# Patient Record
Sex: Female | Born: 1945 | Race: White | Hispanic: No | Smoking: Former smoker
Health system: Southern US, Community
[De-identification: ages and names within clinical notes are randomized; demographics above are authoritative.]

## PROBLEM LIST (undated history)

## (undated) DIAGNOSIS — M1990 Unspecified osteoarthritis, unspecified site: Secondary | ICD-10-CM

## (undated) DIAGNOSIS — F259 Schizoaffective disorder, unspecified: Secondary | ICD-10-CM

## (undated) DIAGNOSIS — F319 Bipolar disorder, unspecified: Secondary | ICD-10-CM

## (undated) DIAGNOSIS — I4891 Unspecified atrial fibrillation: Secondary | ICD-10-CM

## (undated) DIAGNOSIS — K219 Gastro-esophageal reflux disease without esophagitis: Secondary | ICD-10-CM

## (undated) DIAGNOSIS — I509 Heart failure, unspecified: Secondary | ICD-10-CM

## (undated) DIAGNOSIS — J449 Chronic obstructive pulmonary disease, unspecified: Secondary | ICD-10-CM

---

## 2016-07-30 ENCOUNTER — Encounter (HOSPITAL_COMMUNITY): Payer: Self-pay | Admitting: Emergency Medicine

## 2016-07-30 ENCOUNTER — Observation Stay (HOSPITAL_COMMUNITY): Payer: Medicaid Other

## 2016-07-30 ENCOUNTER — Inpatient Hospital Stay (HOSPITAL_COMMUNITY)
Admission: EM | Admit: 2016-07-30 | Discharge: 2016-08-02 | DRG: 641 | Disposition: A | Discharge status: Skilled Nursing Facility | Payer: Medicaid Other | Attending: Internal Medicine | Admitting: Internal Medicine

## 2016-07-30 ENCOUNTER — Emergency Department (HOSPITAL_COMMUNITY): Payer: Medicaid Other

## 2016-07-30 DIAGNOSIS — E890 Postprocedural hypothyroidism: Secondary | ICD-10-CM | POA: Diagnosis present

## 2016-07-30 DIAGNOSIS — R55 Syncope and collapse: Secondary | ICD-10-CM | POA: Diagnosis not present

## 2016-07-30 DIAGNOSIS — I480 Paroxysmal atrial fibrillation: Secondary | ICD-10-CM | POA: Diagnosis present

## 2016-07-30 DIAGNOSIS — G43909 Migraine, unspecified, not intractable, without status migrainosus: Secondary | ICD-10-CM | POA: Diagnosis present

## 2016-07-30 DIAGNOSIS — N390 Urinary tract infection, site not specified: Secondary | ICD-10-CM | POA: Diagnosis present

## 2016-07-30 DIAGNOSIS — R519 Headache, unspecified: Secondary | ICD-10-CM

## 2016-07-30 DIAGNOSIS — E860 Dehydration: Secondary | ICD-10-CM | POA: Diagnosis present

## 2016-07-30 DIAGNOSIS — IMO0002 Reserved for concepts with insufficient information to code with codable children: Secondary | ICD-10-CM

## 2016-07-30 DIAGNOSIS — D649 Anemia, unspecified: Secondary | ICD-10-CM | POA: Diagnosis present

## 2016-07-30 DIAGNOSIS — F319 Bipolar disorder, unspecified: Secondary | ICD-10-CM | POA: Diagnosis present

## 2016-07-30 DIAGNOSIS — N179 Acute kidney failure, unspecified: Secondary | ICD-10-CM | POA: Diagnosis not present

## 2016-07-30 DIAGNOSIS — M1990 Unspecified osteoarthritis, unspecified site: Secondary | ICD-10-CM | POA: Diagnosis present

## 2016-07-30 DIAGNOSIS — E872 Acidosis, unspecified: Secondary | ICD-10-CM | POA: Diagnosis present

## 2016-07-30 DIAGNOSIS — F259 Schizoaffective disorder, unspecified: Secondary | ICD-10-CM | POA: Diagnosis present

## 2016-07-30 DIAGNOSIS — R51 Headache: Secondary | ICD-10-CM

## 2016-07-30 DIAGNOSIS — E041 Nontoxic single thyroid nodule: Secondary | ICD-10-CM

## 2016-07-30 DIAGNOSIS — K219 Gastro-esophageal reflux disease without esophagitis: Secondary | ICD-10-CM | POA: Diagnosis present

## 2016-07-30 DIAGNOSIS — R229 Localized swelling, mass and lump, unspecified: Secondary | ICD-10-CM

## 2016-07-30 DIAGNOSIS — J449 Chronic obstructive pulmonary disease, unspecified: Secondary | ICD-10-CM | POA: Diagnosis present

## 2016-07-30 DIAGNOSIS — F0390 Unspecified dementia without behavioral disturbance: Secondary | ICD-10-CM | POA: Diagnosis present

## 2016-07-30 DIAGNOSIS — B9620 Unspecified Escherichia coli [E. coli] as the cause of diseases classified elsewhere: Secondary | ICD-10-CM | POA: Diagnosis present

## 2016-07-30 DIAGNOSIS — I712 Thoracic aortic aneurysm, without rupture: Secondary | ICD-10-CM | POA: Diagnosis present

## 2016-07-30 DIAGNOSIS — R079 Chest pain, unspecified: Secondary | ICD-10-CM

## 2016-07-30 DIAGNOSIS — F419 Anxiety disorder, unspecified: Secondary | ICD-10-CM | POA: Diagnosis present

## 2016-07-30 DIAGNOSIS — E785 Hyperlipidemia, unspecified: Secondary | ICD-10-CM | POA: Diagnosis present

## 2016-07-30 DIAGNOSIS — E871 Hypo-osmolality and hyponatremia: Secondary | ICD-10-CM | POA: Diagnosis present

## 2016-07-30 HISTORY — DX: Bipolar disorder, unspecified: F31.9

## 2016-07-30 HISTORY — DX: Chronic obstructive pulmonary disease, unspecified: J44.9

## 2016-07-30 HISTORY — DX: Schizoaffective disorder, unspecified: F25.9

## 2016-07-30 HISTORY — DX: Heart failure, unspecified: I50.9

## 2016-07-30 HISTORY — DX: Gastro-esophageal reflux disease without esophagitis: K21.9

## 2016-07-30 HISTORY — DX: Unspecified atrial fibrillation: I48.91

## 2016-07-30 HISTORY — DX: Unspecified osteoarthritis, unspecified site: M19.90

## 2016-07-30 LAB — COMPREHENSIVE METABOLIC PANEL
ALK PHOS: 61 U/L (ref 38–126)
ALT: 14 U/L (ref 14–54)
AST: 27 U/L (ref 15–41)
Albumin: 3.6 g/dL (ref 3.5–5.0)
Anion gap: 14 (ref 5–15)
BILIRUBIN TOTAL: 0.7 mg/dL (ref 0.3–1.2)
BUN: 12 mg/dL (ref 6–20)
CALCIUM: 9.1 mg/dL (ref 8.9–10.3)
CO2: 20 mmol/L — ABNORMAL LOW (ref 22–32)
CREATININE: 1.28 mg/dL — AB (ref 0.44–1.00)
Chloride: 97 mmol/L — ABNORMAL LOW (ref 101–111)
GFR calc Af Amer: 47 mL/min — ABNORMAL LOW (ref >60)
GFR, EST NON AFRICAN AMERICAN: 40 mL/min — AB (ref >60)
Glucose, Bld: 178 mg/dL — ABNORMAL HIGH (ref 65–99)
POTASSIUM: 3.8 mmol/L (ref 3.5–5.1)
Sodium: 131 mmol/L — ABNORMAL LOW (ref 135–145)
TOTAL PROTEIN: 6.3 g/dL — AB (ref 6.5–8.1)

## 2016-07-30 LAB — CBC WITH DIFFERENTIAL/PLATELET
BASOS ABS: 0 10*3/uL (ref 0.0–0.1)
Basophils Relative: 0 %
Eosinophils Absolute: 0.2 10*3/uL (ref 0.0–0.7)
Eosinophils Relative: 2 %
HEMATOCRIT: 32.1 % — AB (ref 36.0–46.0)
HEMOGLOBIN: 10.1 g/dL — AB (ref 12.0–15.0)
LYMPHS PCT: 33 %
Lymphs Abs: 2.6 10*3/uL (ref 0.7–4.0)
MCH: 25.9 pg — ABNORMAL LOW (ref 26.0–34.0)
MCHC: 31.5 g/dL (ref 30.0–36.0)
MCV: 82.3 fL (ref 78.0–100.0)
MONO ABS: 0.5 10*3/uL (ref 0.1–1.0)
Monocytes Relative: 7 %
NEUTROS ABS: 4.6 10*3/uL (ref 1.7–7.7)
Neutrophils Relative %: 58 %
Platelets: 182 10*3/uL (ref 150–400)
RBC: 3.9 MIL/uL (ref 3.87–5.11)
RDW: 18.1 % — AB (ref 11.5–15.5)
WBC: 7.8 10*3/uL (ref 4.0–10.5)

## 2016-07-30 LAB — I-STAT CG4 LACTIC ACID, ED
LACTIC ACID, VENOUS: 4.82 mmol/L — AB (ref 0.5–1.9)
LACTIC ACID, VENOUS: 2.15 mmol/L — AB (ref 0.5–1.9)
Lactic Acid, Venous: 2.54 mmol/L (ref 0.5–1.9)

## 2016-07-30 LAB — URINALYSIS, ROUTINE W REFLEX MICROSCOPIC
Bilirubin Urine: NEGATIVE
Glucose, UA: NEGATIVE mg/dL
Hgb urine dipstick: NEGATIVE
KETONES UR: 5 mg/dL — AB
NITRITE: NEGATIVE
PH: 5 (ref 5.0–8.0)
Protein, ur: 30 mg/dL — AB
SPECIFIC GRAVITY, URINE: 1.021 (ref 1.005–1.030)

## 2016-07-30 LAB — I-STAT TROPONIN, ED
TROPONIN I, POC: 0.01 ng/mL (ref 0.00–0.08)
Troponin i, poc: 0 ng/mL (ref 0.00–0.08)

## 2016-07-30 LAB — CREATININE, URINE, RANDOM: CREATININE, URINE: 251.25 mg/dL

## 2016-07-30 LAB — RAPID URINE DRUG SCREEN, HOSP PERFORMED
AMPHETAMINES: NOT DETECTED
BENZODIAZEPINES: POSITIVE — AB
Barbiturates: NOT DETECTED
Cocaine: NOT DETECTED
Opiates: NOT DETECTED
TETRAHYDROCANNABINOL: NOT DETECTED

## 2016-07-30 LAB — TROPONIN I: Troponin I: <0.03 ng/mL (ref <0.03)

## 2016-07-30 LAB — OSMOLALITY, URINE: Osmolality, Ur: 474 mOsm/kg (ref 300–900)

## 2016-07-30 LAB — SODIUM, URINE, RANDOM: SODIUM UR: 20 mmol/L

## 2016-07-30 LAB — D-DIMER, QUANTITATIVE: D-Dimer, Quant: 3.45 ug/mL-FEU — ABNORMAL HIGH (ref 0.00–0.50)

## 2016-07-30 MED ORDER — CLOZAPINE 25 MG PO TABS
50.0000 mg | ORAL_TABLET | Freq: Every day | ORAL | Status: DC
  Administered 2016-07-31 – 2016-08-01 (×3): 50 mg via ORAL
  Filled 2016-07-30 (×3): qty 2

## 2016-07-30 MED ORDER — SODIUM CHLORIDE 0.9 % IV BOLUS (SEPSIS)
500.0000 mL | Freq: Once | INTRAVENOUS | Status: AC
  Administered 2016-07-30: 500 mL via INTRAVENOUS

## 2016-07-30 MED ORDER — LEVOTHYROXINE SODIUM 88 MCG PO TABS
88.0000 ug | ORAL_TABLET | Freq: Every day | ORAL | Status: DC
  Administered 2016-07-31 – 2016-08-02 (×3): 88 ug via ORAL
  Filled 2016-07-30 (×3): qty 1

## 2016-07-30 MED ORDER — ASPIRIN-ACETAMINOPHEN-CAFFEINE 250-250-65 MG PO TABS
1.0000 | ORAL_TABLET | Freq: Four times a day (QID) | ORAL | Status: AC | PRN
  Administered 2016-07-31: 2 via ORAL
  Administered 2016-07-31: 1 via ORAL
  Filled 2016-07-30 (×3): qty 2

## 2016-07-30 MED ORDER — ENOXAPARIN SODIUM 40 MG/0.4ML ~~LOC~~ SOLN
40.0000 mg | SUBCUTANEOUS | Status: DC
  Administered 2016-07-31 – 2016-08-01 (×2): 40 mg via SUBCUTANEOUS
  Filled 2016-07-30 (×2): qty 0.4

## 2016-07-30 MED ORDER — IOPAMIDOL (ISOVUE-370) INJECTION 76%
INTRAVENOUS | Status: AC
  Administered 2016-07-30: 60 mL via INTRAVENOUS
  Filled 2016-07-30: qty 100

## 2016-07-30 MED ORDER — SODIUM CHLORIDE 0.9% FLUSH
3.0000 mL | Freq: Two times a day (BID) | INTRAVENOUS | Status: DC
  Administered 2016-07-31 (×2): 3 mL via INTRAVENOUS

## 2016-07-30 MED ORDER — DEXTROSE 5 % IV SOLN
1.0000 g | INTRAVENOUS | Status: DC
  Administered 2016-07-30 – 2016-08-01 (×3): 1 g via INTRAVENOUS
  Filled 2016-07-30 (×4): qty 10

## 2016-07-30 MED ORDER — TRAZODONE HCL 150 MG PO TABS
75.0000 mg | ORAL_TABLET | Freq: Every day | ORAL | Status: DC
  Administered 2016-07-30 – 2016-08-01 (×3): 75 mg via ORAL
  Filled 2016-07-30 (×3): qty 1

## 2016-07-30 MED ORDER — ONDANSETRON HCL 4 MG/2ML IJ SOLN: 4.0000 mg | Freq: Four times a day (QID) | INTRAMUSCULAR | Status: DC | PRN

## 2016-07-30 MED ORDER — ALBUTEROL SULFATE (2.5 MG/3ML) 0.083% IN NEBU
2.5000 mg | INHALATION_SOLUTION | RESPIRATORY_TRACT | Status: DC | PRN
Start: 2016-07-30 — End: 2016-08-01

## 2016-07-30 MED ORDER — METOPROLOL TARTRATE 25 MG PO TABS
25.0000 mg | ORAL_TABLET | Freq: Two times a day (BID) | ORAL | Status: DC
  Administered 2016-07-31 – 2016-08-02 (×5): 25 mg via ORAL
  Filled 2016-07-30 (×5): qty 1

## 2016-07-30 MED ORDER — MOMETASONE FURO-FORMOTEROL FUM 200-5 MCG/ACT IN AERO
2.0000 | INHALATION_SPRAY | Freq: Two times a day (BID) | RESPIRATORY_TRACT | Status: DC
  Administered 2016-07-30 – 2016-08-02 (×6): 2 via RESPIRATORY_TRACT
  Filled 2016-07-30: qty 8.8

## 2016-07-30 MED ORDER — ONDANSETRON HCL 4 MG PO TABS
4.0000 mg | ORAL_TABLET | Freq: Four times a day (QID) | ORAL | Status: DC | PRN
  Administered 2016-07-31: 4 mg via ORAL
  Filled 2016-07-30: qty 1

## 2016-07-30 MED ORDER — SODIUM CHLORIDE 0.9 % IV SOLN
INTRAVENOUS | Status: DC
  Administered 2016-07-30: 21:00:00 via INTRAVENOUS

## 2016-07-30 MED ORDER — ATORVASTATIN CALCIUM 10 MG PO TABS
10.0000 mg | ORAL_TABLET | Freq: Every day | ORAL | Status: DC
  Administered 2016-07-31 – 2016-08-02 (×3): 10 mg via ORAL
  Filled 2016-07-30 (×3): qty 1

## 2016-07-30 MED ORDER — SODIUM CHLORIDE 0.9 % IV SOLN
INTRAVENOUS | Status: AC
  Administered 2016-07-31: via INTRAVENOUS

## 2016-07-30 MED ORDER — DULOXETINE HCL 60 MG PO CPEP
60.0000 mg | ORAL_CAPSULE | Freq: Every day | ORAL | Status: DC
  Administered 2016-07-31 – 2016-08-02 (×3): 60 mg via ORAL
  Filled 2016-07-30 (×3): qty 1

## 2016-07-30 MED ORDER — IPRATROPIUM BROMIDE 0.02 % IN SOLN
0.5000 mg | Freq: Four times a day (QID) | RESPIRATORY_TRACT | Status: DC
  Administered 2016-07-31 (×4): 0.5 mg via RESPIRATORY_TRACT
  Filled 2016-07-30 (×4): qty 2.5

## 2016-07-30 MED ORDER — GUAIFENESIN ER 600 MG PO TB12
600.0000 mg | ORAL_TABLET | Freq: Two times a day (BID) | ORAL | Status: DC
  Administered 2016-07-30 – 2016-08-02 (×6): 600 mg via ORAL
  Filled 2016-07-30 (×6): qty 1

## 2016-07-30 MED ORDER — ASPIRIN EC 81 MG PO TBEC
81.0000 mg | DELAYED_RELEASE_TABLET | Freq: Every day | ORAL | Status: DC
  Administered 2016-07-31 – 2016-08-02 (×3): 81 mg via ORAL
  Filled 2016-07-30 (×3): qty 1

## 2016-07-30 MED ORDER — SODIUM CHLORIDE 0.9% FLUSH
3.0000 mL | Freq: Two times a day (BID) | INTRAVENOUS | Status: DC
  Administered 2016-07-31: 3 mL via INTRAVENOUS

## 2016-07-30 MED ORDER — ALPRAZOLAM 0.25 MG PO TABS
0.2500 mg | ORAL_TABLET | Freq: Two times a day (BID) | ORAL | Status: DC
  Administered 2016-07-30 – 2016-08-02 (×6): 0.25 mg via ORAL
  Filled 2016-07-30 (×6): qty 1

## 2016-07-30 NOTE — ED Provider Notes (Signed)
I saw and evaluated the patient, reviewed the resident's note and I agree with the findings and plan.   EKG Interpretation  Date/Time:  Friday July 30 2016 15:23:30 EDT Ventricular Rate:  84 PR Interval:    QRS Duration: 84 QT Interval:  384 QTC Calculation: 454 R Axis:   -54 Text Interpretation:  Sinus rhythm LVH with secondary repolarization abnormality Inferior infarct, age indeterminate Anterior infarct, old No previous ECGs available Confirmed by Vanetta Mulders [id number]) on 07/30/2016 3:26:45 PM       Results for orders placed or performed during the hospital encounter of 07/30/16  CBC with Differential/Platelet  Result Value Ref Range   WBC 7.8 4.0 - 10.5 K/uL   RBC 3.90 3.87 - 5.11 MIL/uL   Hemoglobin 10.1 (L) 12.0 - 15.0 g/dL   HCT 60.4 (L) 54.0 - 98.1 %   MCV 82.3 78.0 - 100.0 fL   MCH 25.9 (L) 26.0 - 34.0 pg   MCHC 31.5 30.0 - 36.0 g/dL   RDW 19.1 (H) 47.8 - 29.5 %   Platelets 182 150 - 400 K/uL   Neutrophils Relative % 58 %   Neutro Abs 4.6 1.7 - 7.7 K/uL   Lymphocytes Relative 33 %   Lymphs Abs 2.6 0.7 - 4.0 K/uL   Monocytes Relative 7 %   Monocytes Absolute 0.5 0.1 - 1.0 K/uL   Eosinophils Relative 2 %   Eosinophils Absolute 0.2 0.0 - 0.7 K/uL   Basophils Relative 0 %   Basophils Absolute 0.0 0.0 - 0.1 K/uL  Comprehensive metabolic panel  Result Value Ref Range   Sodium 131 (L) 135 - 145 mmol/L   Potassium 3.8 3.5 - 5.1 mmol/L   Chloride 97 (L) 101 - 111 mmol/L   CO2 20 (L) 22 - 32 mmol/L   Glucose, Bld 178 (H) 65 - 99 mg/dL   BUN 12 6 - 20 mg/dL   Creatinine, Ser 6.21 (H) 0.44 - 1.00 mg/dL   Calcium 9.1 8.9 - 30.8 mg/dL   Total Protein 6.3 (L) 6.5 - 8.1 g/dL   Albumin 3.6 3.5 - 5.0 g/dL   AST 27 15 - 41 U/L   ALT 14 14 - 54 U/L   Alkaline Phosphatase 61 38 - 126 U/L   Total Bilirubin 0.7 0.3 - 1.2 mg/dL   GFR calc non Af Amer 40 (L) >60 mL/min   GFR calc Af Amer 47 (L) >60 mL/min   Anion gap 14 5 - 15  Rapid urine drug screen (hospital performed)   Result Value Ref Range   Opiates NONE DETECTED NONE DETECTED   Cocaine NONE DETECTED NONE DETECTED   Benzodiazepines POSITIVE (A) NONE DETECTED   Amphetamines NONE DETECTED NONE DETECTED   Tetrahydrocannabinol NONE DETECTED NONE DETECTED   Barbiturates NONE DETECTED NONE DETECTED  Urinalysis, Routine w reflex microscopic  Result Value Ref Range   Color, Urine AMBER (A) YELLOW   APPearance CLOUDY (A) CLEAR   Specific Gravity, Urine 1.021 1.005 - 1.030   pH 5.0 5.0 - 8.0   Glucose, UA NEGATIVE NEGATIVE mg/dL   Hgb urine dipstick NEGATIVE NEGATIVE   Bilirubin Urine NEGATIVE NEGATIVE   Ketones, ur 5 (A) NEGATIVE mg/dL   Protein, ur 30 (A) NEGATIVE mg/dL   Nitrite NEGATIVE NEGATIVE   Leukocytes, UA SMALL (A) NEGATIVE   RBC / HPF 0-5 0 - 5 RBC/hpf   WBC, UA 6-30 0 - 5 WBC/hpf   Bacteria, UA MANY (A) NONE SEEN   Squamous Epithelial /  LPF 0-5 (A) NONE SEEN   Mucous PRESENT    Hyaline Casts, UA PRESENT    Ca Oxalate Crys, UA PRESENT   I-Stat CG4 Lactic Acid, ED  Result Value Ref Range   Lactic Acid, Venous 4.82 (HH) 0.5 - 1.9 mmol/L   Comment NOTIFIED PHYSICIAN   I-stat troponin, ED  Result Value Ref Range   Troponin i, poc 0.00 0.00 - 0.08 ng/mL   Comment 3          I-Stat CG4 Lactic Acid, ED  Result Value Ref Range   Lactic Acid, Venous 2.15 (HH) 0.5 - 1.9 mmol/L   Comment NOTIFIED PHYSICIAN    No results found.  Patient with unexplained syncopal episode in the car today. Sounds as if it was vasovagal. EMS was called initial blood pressure palpated was 60. Patient was laid down started to wake up pressure started to come up. Initial lactic acid here was 4.82 patient without the any kind of infectious process or symptoms ongoing. Workup without any acute findings. Repeat lactic acid was 2. Patient will require admission for the syncopal episode. [place] Was down here for an eye exam. I exam had been completed.  Patient now  without any complaints of the complaint of headache which she has often and has many times. Otherwise no neural deficits. Blood pressure is here have been fine. Do not feel that the headache was contributory to the syncopal event.   Vanetta MuldersZackowski, Wan, MD 07/30/16 [id number]

## 2016-07-30 NOTE — H&P (Addendum)
Angelica Robinson BJY:[id number] DOB: 1945/12/17 DOA: 07/30/2016     PCP: Patient, No Pcp Per   Outpatient Specialists:Cardiology Patient coming from:  From facility [facility] [place]  Chief Complaint: syncope  HPI: Angelica Robinson is a 71 y.o. female with medical history significant of A.fib, COPD, GERD, schizoaffective disorder, CHF, bipolar, dementia    Presented with syncopal event as she was driven by her sister from ophthalmology appointment. Patient resides in a nursing home. Family states she was babbling not making any sense and it seemed to have fallen asleep she was out for about 10 minutes. Family called EMS on their arrival blood pressure initially palpated to 60s but then was up to when measured 94/60 she was confused to wear her location was but otherwise aware of the time He did not have any associated  abdominal pain having been short of breath. Last chest pain event was yesterday.  She does report some headaches they been have been progressive over 4 months ever since she has been at [facility]. She reports headaches are severe Migraine like w aura. She takes something over the counter but it does not help. Never was seen by Neurology   Patient reports long-standing history of dizziness and unable to provide more history regarding this She had a syncopal episode 1 month ago and reports passing out spells. She reports no work up. States she has intermittent chest pain it happens at rest or sometimes wakes her up. She was on some medication for that but it was discontinued. Last cardiac catheterization was 1 year ago in [facility] States it was "ok" She did not need any stents.  She reports hx of anxiety for which she takes xanax and Klonapin Reports good PO intake She have had some dysuria. Reports hx of PNA 1 month ago. She has persistent shortness of breath and wheezing due to COPD and persistent cough productive of yellow  sputum.  Reports hx of dysphagia chopped diet and fluid restriction but thin fluids she has hx of hyponatremia Regarding pertinent Chronic problems: No hx of DM,  Reports hx of MI,  States she was on blood thiner for her a.fib but developed GI bleeding needing transfusion and had to stop anticoagulation only on Aspirin. She has COPD on 2 L at baseline.    IN ER:  Temp (24hrs), Avg:97.9 F (36.6 C), Min:97.9 F (36.6 C), Max:97.9 F (36.6 C)      on arrival  ED Triage Vitals  Enc Vitals Group     BP 07/30/16 1555 110/73     Pulse Rate 07/30/16 1555 84     Resp 07/30/16 1555 19     Temp 07/30/16 1555 97.9 F (36.6 C)     Temp Source 07/30/16 1555 Oral     SpO2 07/30/16 1440 96 %     Weight 07/30/16 1445 174 lb (78.9 kg)     Height 07/30/16 1445 5\' 4"  (1.626 m)     Head Circumference --      Peak Flow --      Pain Score 07/30/16 1445 5     Pain Loc --      Pain Edu? --      Excl. in GC? --   HR 96 BP 137/96 LA 4.82 -> 2.15 ->2.54 Trop <0.03 trop 0.01 D.dimer 3.45 WBC 7.8 HG 10.1 plt 182 Na 131 K 3.8 Cr 1.28 CT head non acute CXR no acute  Following  Medications were ordered in ER: Medications  0.9 %  sodium chloride infusion (not administered)  sodium chloride 0.9 % bolus 500 mL (0 mLs Intravenous Stopped 07/30/16 1746)       Hospitalist was called for admission for syncope  Review of Systems:    Pertinent positives include:  Fatigue, chest pain, shortness of breath at rest. dyspnea on exertion,   productive cough, Constitutional:  No weight loss, night sweats, Fevers, chills, weight loss  HEENT:  No headaches, Difficulty swallowing,Tooth/dental problems,Sore throat,  No sneezing, itching, ear ache, nasal congestion, post nasal drip,  Cardio-vascular:  No Orthopnea, PND, anasarca, dizziness, palpitations.no Bilateral lower extremity swelling  GI:  No heartburn, indigestion, abdominal pain, nausea, vomiting, diarrhea, change in bowel habits, loss of appetite,  melena, blood in stool, hematemesis Resp:   No excess mucus, no No non-productive cough, No coughing up of blood.No change in color of mucus.No wheezing. Skin:  no rash or lesions. No jaundice GU:  no dysuria, change in color of urine, no urgency or frequency. No straining to urinate.  No flank pain.  Musculoskeletal:  No joint pain or no joint swelling. No decreased range of motion. No back pain.  Psych:  No change in mood or affect. No depression or anxiety. No memory loss.  Neuro: no localizing neurological complaints, no tingling, no weakness, no double vision, no gait abnormality, no slurred speech, no confusion  As per HPI otherwise 10 point review of systems negative.   Past Medical History: Past Medical History:  Diagnosis Date  . A-fib (HCC)   . Arthritis   . Bipolar disorder (HCC)   . CHF (congestive heart failure) (HCC)   . COPD (chronic obstructive pulmonary disease) (HCC)   . GERD (gastroesophageal reflux disease)   . Schizoaffective disorder (HCC)    History reviewed. No pertinent surgical history.   Social History:  Ambulatory   walker  Or  wheelchair      reports that she has never smoked. She has never used smokeless tobacco. She reports that she does not drink alcohol or use drugs.  Allergies:   Allergies  Allergen Reactions  . Codeine   . Doxycycline   . Fentanyl   . Hctz [Hydrochlorothiazide]   . Losartan   . Nsaids   . Percogesic [Diphenhydramine-Acetaminophen]   . Simvastatin   . Sulfa Antibiotics   . Tetracyclines & Related   . Valsartan        Family History:   Family History  Problem Relation Age of Onset  . CAD Mother   . CAD Father   . Lung cancer Sister   . Stroke Other     Medications: Prior to Admission medications   Not on File    Physical Exam: Patient Vitals for the past 24 hrs:  BP Temp Temp src Pulse Resp SpO2 Height Weight  07/30/16 1811 128/69 - - 80 (!) 21 97 % - -  07/30/16 1745 117/82 - - 79 18 97 % - -   07/30/16 1730 113/76 - - 80 18 97 % - -  07/30/16 1715 111/78 - - 79 (!) 23 97 % - -  07/30/16 1700 117/84 - - 78 17 97 % - -  07/30/16 1645 104/74 - - 80 20 97 % - -  07/30/16 1555 110/73 97.9 F (36.6 C) Oral 84 19 97 % - -  07/30/16 1445 - - - - - - 5\' 4"  (1.626 m) 78.9 kg (174 lb)  07/30/16 1440 - - - - -  96 % - -    1. General:  in No Acute distress 2. Psychological: Alert and  Oriented 3. Head/ENT:    Dry Mucous Membranes                          Head Non traumatic, neck supple                            Poor Dentition 4. SKIN: decreased Skin turgor,  Skin clean Dry and intact no rash 5. Heart: Regular rate and rhythm no Murmur, Rub or gallop 6. Lungs:  no wheezes occSIONAL crackles   7. Abdomen: Soft,  non-tender, Non distended 8. Lower extremities: no clubbing, cyanosis, or edema 9. Neurologically  strength 5 out of 5 in all 4 extremities cranial nerves II through XII intact 10. MSK: Normal range of motion   body mass index is 29.87 kg/m.  Labs on Admission:   Labs on Admission: I have personally reviewed following labs and imaging studies  CBC:  Recent Labs Lab 07/30/16 1438  WBC 7.8  NEUTROABS 4.6  HGB 10.1*  HCT 32.1*  MCV 82.3  PLT 182   Basic Metabolic Panel:  Recent Labs Lab 07/30/16 1438  NA 131*  K 3.8  CL 97*  CO2 20*  GLUCOSE 178*  BUN 12  CREATININE 1.28*  CALCIUM 9.1   GFR: Estimated Creatinine Clearance: 39.8 mL/min (A) (by C-G formula based on SCr of 1.28 mg/dL (H)). Liver Function Tests:  Recent Labs Lab 07/30/16 1438  AST 27  ALT 14  ALKPHOS 61  BILITOT 0.7  PROT 6.3*  ALBUMIN 3.6   No results for input(s): LIPASE, AMYLASE in the last 168 hours. No results for input(s): AMMONIA in the last 168 hours. Coagulation Profile: No results for input(s): INR, PROTIME in the last 168 hours. Cardiac Enzymes: No results for input(s): CKTOTAL, CKMB, CKMBINDEX, TROPONINI in the last 168 hours. BNP (last 3 results) No results  for input(s): PROBNP in the last 8760 hours. HbA1C: No results for input(s): HGBA1C in the last 72 hours. CBG: No results for input(s): GLUCAP in the last 168 hours. Lipid Profile: No results for input(s): CHOL, HDL, LDLCALC, TRIG, CHOLHDL, LDLDIRECT in the last 72 hours. Thyroid Function Tests: No results for input(s): TSH, T4TOTAL, FREET4, T3FREE, THYROIDAB in the last 72 hours. Anemia Panel: No results for input(s): VITAMINB12, FOLATE, FERRITIN, TIBC, IRON, RETICCTPCT in the last 72 hours. Urine analysis:    Component Value Date/Time   COLORURINE AMBER (A) 07/30/2016 1620   APPEARANCEUR CLOUDY (A) 07/30/2016 1620   LABSPEC 1.021 07/30/2016 1620   PHURINE 5.0 07/30/2016 1620   GLUCOSEU NEGATIVE 07/30/2016 1620   HGBUR NEGATIVE 07/30/2016 1620   BILIRUBINUR NEGATIVE 07/30/2016 1620   KETONESUR 5 (A) 07/30/2016 1620   PROTEINUR 30 (A) 07/30/2016 1620   NITRITE NEGATIVE 07/30/2016 1620   LEUKOCYTESUR SMALL (A) 07/30/2016 1620   Sepsis Labs: @LABRCNTIP (procalcitonin:4,lacticidven:4) )No results found for this or any previous visit (from the past 240 hour(s)).     UA possible UTI  Urine culture ordered  No results found for: HGBA1C  Estimated Creatinine Clearance: 39.8 mL/min (A) (by C-G formula based on SCr of 1.28 mg/dL (H)).  BNP (last 3 results) No results for input(s): PROBNP in the last 8760 hours.   ECG REPORT  Independently reviewed Rate:84  Rhythm: SR ST&T Change: LVH secondary repolarization QTC 545  Filed Weights  07/30/16 1445  Weight: 78.9 kg (174 lb)     Cultures: No results found for: SDES, SPECREQUEST, CULT, REPTSTATUS   Radiological Exams on Admission: No results found.  Chart has been reviewed    Assessment/Plan  71 y.o. female with medical history significant of A.fib, COPD, GERD, schizoaffective disorder, CHF, bipolar, dementia  Admitted for syncope possibly secondary to dehydration  Present on Admission: . Syncope -  given risk  factor will admit rehydrate obtain CE, ECHO, given elevated d.dimer and chest pain obtain CTA to RO PE  monitor on tele and obtain carotid dopplers  . COPD (chronic obstructive pulmonary disease) (HCC) - Continue home inhalers currently patient appears to be stable she has chronic cough. . AF (paroxysmal atrial fibrillation) (HCC) -           - CHA2DS2 vas score 4 :  Not on anticoagulation secondary to   recurrent bleeding on aspirin continue         -  Rate control:  Currently controlled with   Metoprolol,        . Hyponatremia secondary to dehydration will obtain urinary tract to rehydrate . Lactic acidosis  - will continue to rehydrate and cycle lactic acid . Anemia - states this is chronic obtain anemia panel and Hemoccult stool . GERD (gastroesophageal reflux disease) - continue home medications currently stable . Dehydration rehydrate and check orthostatics prior to discharge . AKI (acute kidney injury) (HCC) mild likely secondary to dehydration . Acute lower UTI treat with Rocephin await results of urine culture . Chest pain cycle cardiac enzymes obtain echogram conjunctivae to d-dimer check for PE with  CT angiogram of the chest Headache chronic current phone need to have follow-up with neurology  Other plan as per orders.  DVT prophylaxis:   Lovenox     Code Status:  FULL CODE  as per patient   Family Communication:   Family not at  Bedside    Disposition Plan:                             Back to current facility when stable                                        Would benefit from PT/OT eval prior to DC  ordered                   Social Work    CONSUTLED                        Consults called: none   Admission status:    obs   Level of care    tele         I have spent a total of 56 min on this admission   Angelica Robinson 07/30/2016, 11:22 PM    [facility]  Pager [phone]   after 2 AM please page floor coverage PA If 7AM-7PM, please contact the day  team taking care of the patient  [email or web address]  Password [email or web address]
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
## 2016-07-30 NOTE — ED Notes (Signed)
Consulting MD at bedside (hospitalist)
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
## 2016-07-30 NOTE — ED Notes (Addendum)
Lactic acid = 4.82, EDP Zackoski notified

## 2016-07-31 ENCOUNTER — Observation Stay (HOSPITAL_COMMUNITY): Payer: Medicaid Other

## 2016-07-31 DIAGNOSIS — K219 Gastro-esophageal reflux disease without esophagitis: Secondary | ICD-10-CM | POA: Diagnosis present

## 2016-07-31 DIAGNOSIS — D6489 Other specified anemias: Secondary | ICD-10-CM | POA: Diagnosis not present

## 2016-07-31 DIAGNOSIS — E872 Acidosis: Secondary | ICD-10-CM | POA: Diagnosis present

## 2016-07-31 DIAGNOSIS — E871 Hypo-osmolality and hyponatremia: Secondary | ICD-10-CM | POA: Diagnosis present

## 2016-07-31 DIAGNOSIS — E860 Dehydration: Secondary | ICD-10-CM | POA: Diagnosis not present

## 2016-07-31 DIAGNOSIS — I480 Paroxysmal atrial fibrillation: Secondary | ICD-10-CM

## 2016-07-31 DIAGNOSIS — E041 Nontoxic single thyroid nodule: Secondary | ICD-10-CM

## 2016-07-31 DIAGNOSIS — E890 Postprocedural hypothyroidism: Secondary | ICD-10-CM | POA: Diagnosis present

## 2016-07-31 DIAGNOSIS — R55 Syncope and collapse: Secondary | ICD-10-CM

## 2016-07-31 DIAGNOSIS — N179 Acute kidney failure, unspecified: Secondary | ICD-10-CM | POA: Diagnosis present

## 2016-07-31 DIAGNOSIS — D649 Anemia, unspecified: Secondary | ICD-10-CM | POA: Diagnosis present

## 2016-07-31 DIAGNOSIS — M1990 Unspecified osteoarthritis, unspecified site: Secondary | ICD-10-CM | POA: Diagnosis present

## 2016-07-31 DIAGNOSIS — G43909 Migraine, unspecified, not intractable, without status migrainosus: Secondary | ICD-10-CM | POA: Diagnosis present

## 2016-07-31 DIAGNOSIS — F259 Schizoaffective disorder, unspecified: Secondary | ICD-10-CM | POA: Diagnosis present

## 2016-07-31 DIAGNOSIS — F0390 Unspecified dementia without behavioral disturbance: Secondary | ICD-10-CM | POA: Diagnosis present

## 2016-07-31 DIAGNOSIS — R229 Localized swelling, mass and lump, unspecified: Secondary | ICD-10-CM

## 2016-07-31 DIAGNOSIS — N390 Urinary tract infection, site not specified: Secondary | ICD-10-CM | POA: Diagnosis present

## 2016-07-31 DIAGNOSIS — J449 Chronic obstructive pulmonary disease, unspecified: Secondary | ICD-10-CM | POA: Diagnosis present

## 2016-07-31 DIAGNOSIS — F319 Bipolar disorder, unspecified: Secondary | ICD-10-CM | POA: Diagnosis present

## 2016-07-31 DIAGNOSIS — I712 Thoracic aortic aneurysm, without rupture: Secondary | ICD-10-CM | POA: Diagnosis present

## 2016-07-31 DIAGNOSIS — B9620 Unspecified Escherichia coli [E. coli] as the cause of diseases classified elsewhere: Secondary | ICD-10-CM | POA: Diagnosis present

## 2016-07-31 DIAGNOSIS — F419 Anxiety disorder, unspecified: Secondary | ICD-10-CM | POA: Diagnosis present

## 2016-07-31 DIAGNOSIS — E785 Hyperlipidemia, unspecified: Secondary | ICD-10-CM | POA: Diagnosis present

## 2016-07-31 LAB — CBC
HEMATOCRIT: 37.1 % (ref 36.0–46.0)
Hemoglobin: 11.5 g/dL — ABNORMAL LOW (ref 12.0–15.0)
MCH: 25.7 pg — AB (ref 26.0–34.0)
MCHC: 31 g/dL (ref 30.0–36.0)
MCV: 83 fL (ref 78.0–100.0)
PLATELETS: 174 10*3/uL (ref 150–400)
RBC: 4.47 MIL/uL (ref 3.87–5.11)
RDW: 18.1 % — AB (ref 11.5–15.5)
WBC: 8 10*3/uL (ref 4.0–10.5)

## 2016-07-31 LAB — ECHOCARDIOGRAM COMPLETE
CHL CUP MV DEC (S): 187
CHL CUP RV SYS PRESS: 32 mmHg
E/e' ratio: 16.57
EWDT: 187 ms
FS: 27 % — AB (ref 28–44)
Height: 64 in
IV/PV OW: 0.87
LA ID, A-P, ES: 39 mm
LA diam end sys: 39 mm
LA vol index: 27.9 mL/m2
LA vol: 52.9 mL
LADIAMINDEX: 2.06 cm/m2
LAVOLA4C: 53.7 mL
LV E/e' medial: 16.57
LV TDI E'LATERAL: 5.36
LV e' LATERAL: 5.36 cm/s
LVEEAVG: 16.57
LVOT VTI: 22.9 cm
LVOT area: 3.46 cm2
LVOT peak vel: 111 cm/s
LVOTD: 21 mm
LVOTSV: 79 mL
Lateral S' vel: 10.8 cm/s
MV Peak grad: 3 mmHg
MVPKAVEL: 133 m/s
MVPKEVEL: 88.8 m/s
PW: 10.1 mm — AB (ref 0.6–1.1)
Reg peak vel: 271 cm/s
TAPSE: 20 mm
TDI e' medial: 4.05
TRMAXVEL: 271 cm/s
Weight: 2732.8 oz

## 2016-07-31 LAB — BLOOD CULTURE ID PANEL (REFLEXED)
ACINETOBACTER BAUMANNII: NOT DETECTED
CANDIDA ALBICANS: NOT DETECTED
CANDIDA GLABRATA: NOT DETECTED
CANDIDA PARAPSILOSIS: NOT DETECTED
Candida krusei: NOT DETECTED
Candida tropicalis: NOT DETECTED
ENTEROBACTER CLOACAE COMPLEX: NOT DETECTED
ENTEROBACTERIACEAE SPECIES: NOT DETECTED
ESCHERICHIA COLI: NOT DETECTED
Enterococcus species: NOT DETECTED
HAEMOPHILUS INFLUENZAE: NOT DETECTED
KLEBSIELLA OXYTOCA: NOT DETECTED
KLEBSIELLA PNEUMONIAE: NOT DETECTED
Listeria monocytogenes: NOT DETECTED
METHICILLIN RESISTANCE: DETECTED — AB
Neisseria meningitidis: NOT DETECTED
PSEUDOMONAS AERUGINOSA: NOT DETECTED
Proteus species: NOT DETECTED
STREPTOCOCCUS PNEUMONIAE: NOT DETECTED
STREPTOCOCCUS PYOGENES: NOT DETECTED
STREPTOCOCCUS SPECIES: NOT DETECTED
Serratia marcescens: NOT DETECTED
Staphylococcus aureus (BCID): NOT DETECTED
Staphylococcus species: DETECTED — AB
Streptococcus agalactiae: NOT DETECTED

## 2016-07-31 LAB — RETICULOCYTES
RBC.: 4.47 MIL/uL (ref 3.87–5.11)
RETIC CT PCT: 1.1 % (ref 0.4–3.1)
Retic Count, Absolute: 49.2 10*3/uL (ref 19.0–186.0)

## 2016-07-31 LAB — COMPREHENSIVE METABOLIC PANEL
ALBUMIN: 3.5 g/dL (ref 3.5–5.0)
ALT: 14 U/L (ref 14–54)
AST: 20 U/L (ref 15–41)
Alkaline Phosphatase: 60 U/L (ref 38–126)
Anion gap: 13 (ref 5–15)
BILIRUBIN TOTAL: 0.4 mg/dL (ref 0.3–1.2)
BUN: 7 mg/dL (ref 6–20)
CHLORIDE: 104 mmol/L (ref 101–111)
CO2: 20 mmol/L — ABNORMAL LOW (ref 22–32)
CREATININE: 0.73 mg/dL (ref 0.44–1.00)
Calcium: 9 mg/dL (ref 8.9–10.3)
GFR calc Af Amer: >60 mL/min (ref >60)
GFR calc non Af Amer: >60 mL/min (ref >60)
GLUCOSE: 96 mg/dL (ref 65–99)
POTASSIUM: 3.8 mmol/L (ref 3.5–5.1)
Sodium: 137 mmol/L (ref 135–145)
Total Protein: 6.2 g/dL — ABNORMAL LOW (ref 6.5–8.1)

## 2016-07-31 LAB — MAGNESIUM: Magnesium: 1.6 mg/dL — ABNORMAL LOW (ref 1.7–2.4)

## 2016-07-31 LAB — LACTIC ACID, PLASMA: Lactic Acid, Venous: 3.1 mmol/L (ref 0.5–1.9)

## 2016-07-31 LAB — MRSA PCR SCREENING: MRSA by PCR: POSITIVE — AB

## 2016-07-31 LAB — FOLATE: Folate: 11.3 ng/mL (ref >5.9)

## 2016-07-31 LAB — GLUCOSE, CAPILLARY: Glucose-Capillary: 97 mg/dL (ref 65–99)

## 2016-07-31 LAB — IRON AND TIBC
Iron: 47 ug/dL (ref 28–170)
SATURATION RATIOS: 14 % (ref 10.4–31.8)
TIBC: 343 ug/dL (ref 250–450)
UIBC: 296 ug/dL

## 2016-07-31 LAB — VITAMIN B12: Vitamin B-12: 727 pg/mL (ref 180–914)

## 2016-07-31 LAB — TROPONIN I
Troponin I: <0.03 ng/mL (ref <0.03)
Troponin I: <0.03 ng/mL (ref <0.03)

## 2016-07-31 LAB — TSH: TSH: 0.684 u[IU]/mL (ref 0.350–4.500)

## 2016-07-31 LAB — FERRITIN: FERRITIN: 19 ng/mL (ref 11–307)

## 2016-07-31 LAB — ETHANOL: Alcohol, Ethyl (B): <5 mg/dL (ref <5)

## 2016-07-31 LAB — PHOSPHORUS: PHOSPHORUS: 3 mg/dL (ref 2.5–4.6)

## 2016-07-31 MED ORDER — SODIUM CHLORIDE 0.9 % IV SOLN
INTRAVENOUS | Status: DC
  Administered 2016-07-31 – 2016-08-02 (×4): via INTRAVENOUS

## 2016-07-31 MED ORDER — IPRATROPIUM BROMIDE 0.02 % IN SOLN: 0.5000 mg | Freq: Four times a day (QID) | RESPIRATORY_TRACT | Status: DC | PRN

## 2016-07-31 MED ORDER — MAGNESIUM SULFATE 2 GM/50ML IV SOLN
2.0000 g | Freq: Once | INTRAVENOUS | Status: AC
Start: 2016-07-31 — End: 2016-07-31
  Administered 2016-07-31: 2 g via INTRAVENOUS
  Filled 2016-07-31: qty 50

## 2016-07-31 MED ORDER — CHLORHEXIDINE GLUCONATE CLOTH 2 % EX PADS
6.0000 | MEDICATED_PAD | Freq: Every day | CUTANEOUS | Status: DC
  Administered 2016-07-31 – 2016-08-01 (×2): 6 via TOPICAL

## 2016-07-31 MED ORDER — MUPIROCIN 2 % EX OINT
1.0000 "application " | TOPICAL_OINTMENT | Freq: Two times a day (BID) | CUTANEOUS | Status: DC
  Administered 2016-07-31 – 2016-08-02 (×5): 1 via NASAL
  Filled 2016-07-31: qty 22

## 2016-07-31 NOTE — Progress Notes (Signed)
Patien B/P 152/96/ 170/97 . Metoprolol 25 mg  given. MD notified.Patient asymptomatic.  PT at bedside.

## 2016-07-31 NOTE — Progress Notes (Signed)
Patient ID: Angelica PaddockHelen Mae Robinson, female   DOB: 11/09/1945, 71 y.o.   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 Imaging studies reviewed by Dr. Fredia SorrowYamagata. 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 He recommends obtaining nonurgent thyroid ultrasound to fully assess area. Also please correlate findings with any previous outside imaging studies or surgical notes. Above discussed with Dr.Akula.

## 2016-07-31 NOTE — Progress Notes (Signed)
Patient Critical lab value:  Lactic acid 3.1. MD notified.

## 2016-07-31 NOTE — Progress Notes (Signed)
PHARMACY - PHYSICIAN COMMUNICATION CRITICAL VALUE ALERT - BLOOD CULTURE IDENTIFICATION (BCID)  Results for orders placed or performed during the hospital encounter of 07/30/16  Blood Culture ID Panel (Reflexed) (Collected: 07/30/2016  3:52 PM)  Result Value Ref Range   Enterococcus species NOT DETECTED NOT DETECTED   Listeria monocytogenes NOT DETECTED NOT DETECTED   Staphylococcus species DETECTED (A) NOT DETECTED   Staphylococcus aureus NOT DETECTED NOT DETECTED   Methicillin resistance DETECTED (A) NOT DETECTED   Streptococcus species NOT DETECTED NOT DETECTED   Streptococcus agalactiae NOT DETECTED NOT DETECTED   Streptococcus pneumoniae NOT DETECTED NOT DETECTED   Streptococcus pyogenes NOT DETECTED NOT DETECTED   Acinetobacter baumannii NOT DETECTED NOT DETECTED   Enterobacteriaceae species NOT DETECTED NOT DETECTED   Enterobacter cloacae complex NOT DETECTED NOT DETECTED   Escherichia coli NOT DETECTED NOT DETECTED   Klebsiella oxytoca NOT DETECTED NOT DETECTED   Klebsiella pneumoniae NOT DETECTED NOT DETECTED   Proteus species NOT DETECTED NOT DETECTED   Serratia marcescens NOT DETECTED NOT DETECTED   Haemophilus influenzae NOT DETECTED NOT DETECTED   Neisseria meningitidis NOT DETECTED NOT DETECTED   Pseudomonas aeruginosa NOT DETECTED NOT DETECTED   Candida albicans NOT DETECTED NOT DETECTED   Candida glabrata NOT DETECTED NOT DETECTED   Candida krusei NOT DETECTED NOT DETECTED   Candida parapsilosis NOT DETECTED NOT DETECTED   Candida tropicalis NOT DETECTED NOT DETECTED    Name of physician (or Provider) Contacted: Dr. Blake DivineAkula  Changes to prescribed antibiotics required: On ceftriaxone for UTI. No changes needed at this time.   Fredrik RiggerMarkle, Silvia Hightower Sue 07/31/2016  6:23 PM

## 2016-07-31 NOTE — Evaluation (Signed)
Physical Therapy Evaluation Patient Details Name: Angelica Robinson MRN: [id number] DOB: [birth year] Today's Date: 07/31/2016   History of Present Illness  71 y.o. female admitted on 07/30/16 due to syncopal episode.  Pt dx with hyponatremia, lactic acidosis, AKI, UTI all thought to be from dehydration.  Orthostatics ordered as well.  Pt with significant PMH of COPD, CHF, A-fib, schizoaffective d/o, and bipolar d/o.  Clinical Impression  Pt was able to get up OOB and ambulate around the end of the bed with one person assist, increased DOE 2-3/4, but O2 sats stable on 2 L O2 [place].  Pt's BPs listed below and pt was asymptomatic with gait and standing (no reports of lightheadedness or dizziness, primarily just DOE).  Pt is appropriate to return to SNF at discharge.   PT to follow acutely for deficits listed below.       07/31/16 1053  Vital Signs  Pulse Rate 83  BP (!) 170/97 (RN made aware)  BP Location Right Arm  BP Method Automatic  Patient Position (if appropriate) Sitting  Orthostatic Lying   BP- Lying (pt already seated in the chair)  Orthostatic Sitting  BP- Sitting (!) 170/97  Pulse- Sitting 83  Orthostatic Standing at 0 minutes  BP- Standing at 0 minutes (!) 149/112  Pulse- Standing at 0 minutes 95  Orthostatic Standing at 3 minutes  BP- Standing at 3 minutes (!) 136/95 (pt does not report any lightheadedness)  Pulse- Standing at 3 minutes 99  Oxygen Therapy  SpO2 97 %  O2 Device Nasal Cannula  O2 Flow Rate (L/min) 2 L/min     Follow Up Recommendations SNF    Equipment Recommendations  None recommended by PT    Recommendations for Other Services   NA    Precautions / Restrictions Precautions Precautions: Fall      Mobility  Bed Mobility Overal bed mobility: Needs Assistance Bed Mobility: Rolling;Sidelying to Sit Rolling: Modified independent (Device/Increase time) Sidelying to sit: Min assist;HOB elevated       General bed mobility comments: Min hand held  assist to pull up to sitting from side lying.    Transfers Overall transfer level: Needs assistance Equipment used: Rolling walker (2 wheeled) Transfers: Sit to/from Stand Sit to Stand: Min assist         General transfer comment: Min assist to support trunk to power up to standing.   Ambulation/Gait Ambulation/Gait assistance: Min assist Ambulation Distance (Feet): 15 Feet Assistive device: Rolling walker (2 wheeled) Gait Pattern/deviations: Step-through pattern;Shuffle;Trunk flexed     General Gait Details: Pt with increased DOE during gait up to 2-3/4 with short distance gait.  O2 on 2 L O2 [place] stable in the mid to upper 90s.  Per pt report she gets "winded" at baseline.           Balance Overall balance assessment: Needs assistance Sitting-balance support: Feet supported;Bilateral upper extremity supported Sitting balance-Leahy Scale: Fair     Standing balance support: Bilateral upper extremity supported Standing balance-Leahy Scale: Poor Standing balance comment: min assist and support of RW                             Pertinent Vitals/Pain Pain Assessment: Faces Faces Pain Scale: Hurts little more Pain Location: head, bil shoulders, both chronic Pain Descriptors / Indicators: Aching;Burning;Grimacing;Guarding Pain Intervention(s): Limited activity within patient's tolerance;Monitored during session;Repositioned    Home Living Family/patient expects to be discharged to:: Skilled nursing facility  Additional Comments: Pt reports she was at SNF for rehab, but wants to transition to ALF after she does rehab.     Prior Function Level of Independence: Needs assistance   Gait / Transfers Assistance Needed: ambulating with rollator and assistance at SNF.       Comments: Per pt report she uses O2 at baseline.      Hand Dominance        Extremity/Trunk Assessment   Upper Extremity Assessment Upper Extremity Assessment: RUE  deficits/detail;LUE deficits/detail RUE Deficits / Details: pt able to lift against gravity to ~80 degrees.  grip and elbow seem WNL for tasks assessed.  Per pt report, " They were putting biofreeze on it at the nursing home" and that seemed to help her shoulder pain.  LUE Deficits / Details: left arm AROM seems less than right arm ~45 degrees of flexion against gravity, grip and elbow flexion grossly WNL    Lower Extremity Assessment Lower Extremity Assessment: Generalized weakness    Cervical / Trunk Assessment Cervical / Trunk Assessment: Kyphotic  Communication   Communication: No difficulties  Cognition Arousal/Alertness: Awake/alert Behavior During Therapy: WFL for tasks assessed/performed Overall Cognitive Status: History of cognitive impairments - at baseline                                           Exercises General Exercises - Lower Extremity Ankle Circles/Pumps: AROM;Both;20 reps Long Arc Quad: AROM;Both;20 reps   Assessment/Plan    PT Assessment Patient needs continued PT services  PT Problem List Decreased strength;Decreased activity tolerance;Decreased balance;Decreased mobility;Cardiopulmonary status limiting activity       PT Treatment Interventions DME instruction;Gait training;Functional mobility training;Therapeutic activities;Therapeutic exercise;Balance training;Patient/family education;Modalities    PT Goals (Current goals can be found in the Care Plan section)  Acute Rehab PT Goals Patient Stated Goal: to get back to rehab  PT Goal Formulation: With patient Time For Goal Achievement: 08/14/16 Potential to Achieve Goals: Good    Frequency Min 2X/week (would benefit from more at SNF )        AM-PAC PT "6 Clicks" Daily Activity  Outcome Measure Difficulty turning over in bed (including adjusting bedclothes, sheets and blankets)?: None Difficulty moving from lying on back to sitting on the side of the bed? : Total Difficulty  sitting down on and standing up from a chair with arms (e.g., wheelchair, bedside commode, etc,.)?: Total Help needed moving to and from a bed to chair (including a wheelchair)?: A Little Help needed walking in hospital room?: A Little Help needed climbing 3-5 steps with a railing? : A Lot 6 Click Score: 14    End of Session Equipment Utilized During Treatment: Gait belt;Oxygen (2 L O2 [place]) Activity Tolerance: Patient limited by fatigue Patient left: in chair;with call bell/phone within reach;with chair alarm set Nurse Communication: Mobility status;Other (comment) (elevated BP and orthostatics) PT Visit Diagnosis: Unsteadiness on feet (R26.81);Muscle weakness (generalized) (M62.81);Difficulty in walking, not elsewhere classified (R26.2);Other (comment) (decreased activity tolerance)    Time: 4782-9562 PT Time Calculation (min) (ACUTE ONLY): 36 min   Charges:   PT Evaluation $PT Eval Moderate Complexity: 1 Procedure PT Treatments $Therapeutic Activity: 8-22 mins   PT G Codes:   PT G-Codes **NOT FOR INPATIENT CLASS** Functional Assessment Tool Used: AM-PAC 6 Clicks Basic Mobility Functional Limitation: Mobility: Walking and moving around Mobility: Walking and Moving Around Current Status (Z3086): At  least 40 percent but less than 60 percent impaired, limited or restricted Mobility: Walking and Moving Around Goal Status [phone](G8979): At least 20 percent but less than 40 percent impaired, limited or restricted   Artina Minella B. Jakeia Carreras, PT, DPT [phone]   07/31/2016, 11:23 AM

## 2016-07-31 NOTE — Progress Notes (Signed)
  Echocardiogram 2D Echocardiogram has been performed.  Angelica Robinson, Angelica Robinson 07/31/2016, 4:01 PM

## 2016-07-31 NOTE — Progress Notes (Signed)
   07/31/16 1105  Clinical Encounter Type  Visited With Patient  Visit Type Other (Comment) ([place] consult)  Spiritual Encounters  Spiritual Needs Emotional  Stress Factors  Patient Stress Factors None identified  Introduction to Pt. Pt reports doing well and appreciated contact.

## 2016-07-31 NOTE — Progress Notes (Signed)
*  PRELIMINARY RESULTS* Vascular Ultrasound Carotid Duplex (Doppler) has been completed.  Preliminary findings: Very limited exam due to patient body habitus and penetration. No obvious signs of hemodynamically significant stenosis.  Angelica FischerCharlotte C Romney Robinson 07/31/2016, 4:20 PM

## 2016-07-31 NOTE — Progress Notes (Signed)
PROGRESS NOTE    Angelica Robinson  KGM:[id number] DOB: [birth year] DOA: 07/30/2016 PCP: Patient, No Pcp Per   Brief Narrative: Angelica Robinson is a 71 y.o. female with medical history significant of A.fib, COPD, GERD, schizoaffective disorder, CHF, bipolar, dementia. Had a syncopal episode in the car,. She was admitted for further evaluation.   Assessment & Plan:   Active Problems:   Syncope   COPD (chronic obstructive pulmonary disease) (HCC)   AF (paroxysmal atrial fibrillation) (HCC)   Hyponatremia   Lactic acidosis   Anemia   GERD (gastroesophageal reflux disease)   Dehydration   AKI (acute kidney injury) (HCC)   Acute lower UTI   Chest pain   Syncope:  Vaso vagal possibly from dehydration and hypotension. CT chest ruled out PE.  Elevated lactic acid on admission, trend lactic acid.  Still orthostatic, but without any symptoms.  Continue gentle hydration and therapy evaluation  Further work up with serial troponin's, echocardiogram and carotid duplex.    Atrial fibrillation: paroxysmal, not on anti coagulation, secondary to GI bleed.  CHA2DS2 score is 4. On aspirin, which will be continued.    Hyponatremia: secondary to dehydration.  Hydrate and repeat is normal.    Hypomagnesemia: will be repleted.    COPD:   no wheezing heard. Resume dulera.    Hypothyroidism;  Resume synthroid. TSH wnl.    Acute kidney injury: secondary to pre renal azotemia, from dehydration.  Repeat levels have improved.  Continue with gentle hydration.    GERD: STABLE.    4.3 cm ascending aortic aneurysm without apparent dissection. Recommend annual follow up with CTA OR MRI.   3.6 cm heterogeneous retroclavicular mass on the left:  Will need a biopsy. Will get IR consult.   Abnormal UA/ UTI; urine cultures sent.  On rocephin.  Blood cultures also sent and pending.   Mild normocytic anemia;  Hemoglobin stable around 11.5  Hyperlipidemia:  Resume statin.   DVT  prophylaxis: (Lovenox) Code Status: (Full) Family Communication: none at bedside.  Disposition Plan: back to SNF tomorrow.   Consultants:   none  Procedures:  Echo US duplex o f the carotids.   Antimicrobials:rocephin.   Subjective: Reports feeling better, but not back to baseline,  No dizziness. Still orthostatic.   Objective: Vitals:   07/31/16 0408 07/31/16 0726 07/31/16 0950 07/31/16 1053  BP: 128/84  (!) 152/96 (!) 170/97  Pulse: 94  95 83  Resp: 18     Temp: 98.7 F (37.1 C)     TempSrc: Oral     SpO2: 98% 99% 100% 97%  Weight:      Height:        Intake/Output Summary (Last 24 hours) at 07/31/16 1109 Last data filed at 07/31/16 1015  Gross per 24 hour  Intake          1789.25 ml  Output             2400 ml  Net          -610.75 ml   Filed Weights   07/30/16 1445 07/30/16 2158  Weight: 78.9 kg (174 lb) 77.5 kg (170 lb 12.8 oz)    Examination:  General exam: Appears calm and comfortable  Respiratory system: Clear to auscultation. Respiratory effort normal. Cardiovascular system: S1 & S2 heard, RRR. No JVD, murmurs, rubs, gallops or clicks. No pedal edema. Gastrointestinal system: Abdomen is nondistended, soft and nontender. No organomegaly or masses felt. Normal bowel sounds heard. Central nervous system: Alert  and oriented. No focal neurological deficits. Extremities: Symmetric 5 x 5 power. Skin: No rashes, lesions or ulcers Psychiatry: Judgement and insight appear normal. Mood & affect appropriate.     Data Reviewed: I have personally reviewed following labs and imaging studies  CBC:  Recent Labs Lab 07/30/16 1438 07/31/16 0644  WBC 7.8 8.0  NEUTROABS 4.6  --   HGB 10.1* 11.5*  HCT 32.1* 37.1  MCV 82.3 83.0  PLT 182 174   Basic Metabolic Panel:  Recent Labs Lab 07/30/16 1438 07/31/16 0644  NA 131* 137  K 3.8 3.8  CL 97* 104  CO2 20* 20*  GLUCOSE 178* 96  BUN 12 7  CREATININE 1.28* 0.73  CALCIUM 9.1 9.0  MG  --  1.6*    PHOS  --  3.0   GFR: Estimated Creatinine Clearance: 63.1 mL/min (by C-G formula based on SCr of 0.73 mg/dL). Liver Function Tests:  Recent Labs Lab 07/30/16 1438 07/31/16 0644  AST 27 20  ALT 14 14  ALKPHOS 61 60  BILITOT 0.7 0.4  PROT 6.3* 6.2*  ALBUMIN 3.6 3.5   No results for input(s): LIPASE, AMYLASE in the last 168 hours. No results for input(s): AMMONIA in the last 168 hours. Coagulation Profile: No results for input(s): INR, PROTIME in the last 168 hours. Cardiac Enzymes:  Recent Labs Lab 07/30/16 2118 07/31/16 0137 07/31/16 0644  TROPONINI <0.03 <0.03 <0.03   BNP (last 3 results) No results for input(s): PROBNP in the last 8760 hours. HbA1C: No results for input(s): HGBA1C in the last 72 hours. CBG:  Recent Labs Lab 07/31/16 0632  GLUCAP 97   Lipid Profile: No results for input(s): CHOL, HDL, LDLCALC, TRIG, CHOLHDL, LDLDIRECT in the last 72 hours. Thyroid Function Tests:  Recent Labs  07/31/16 0137  TSH 0.684   Anemia Panel:  Recent Labs  07/31/16 0137 07/31/16 0644  VITAMINB12  --  727  FOLATE 11.3  --   FERRITIN  --  19  TIBC  --  343  IRON  --  47  RETICCTPCT  --  1.1   Sepsis Labs:  Recent Labs Lab 07/30/16 1521 07/30/16 1836 07/30/16 2128  LATICACIDVEN 4.82* 2.15* 2.54*    Recent Results (from the past 240 hour(s))  Culture, blood (Routine X 2) w Reflex to ID Panel     Status: None (Preliminary result)   Collection Time: 07/30/16  3:40 PM  Result Value Ref Range Status   Specimen Description BLOOD LEFT ANTECUBITAL  Final   Special Requests IN PEDIATRIC BOTTLE Blood Culture adequate volume  Final   Culture NO GROWTH < 24 HOURS  Final   Report Status PENDING  Incomplete  Culture, blood (Routine X 2) w Reflex to ID Panel     Status: None (Preliminary result)   Collection Time: 07/30/16  3:52 PM  Result Value Ref Range Status   Specimen Description BLOOD LEFT WRIST  Final   Special Requests   Final    BOTTLES DRAWN  AEROBIC AND ANAEROBIC Blood Culture adequate volume   Culture NO GROWTH < 24 HOURS  Final   Report Status PENDING  Incomplete  MRSA PCR Screening     Status: Abnormal   Collection Time: 07/30/16 11:29 PM  Result Value Ref Range Status   MRSA by PCR POSITIVE (A) NEGATIVE Final    Comment:        The GeneXpert MRSA Assay (FDA approved for NASAL specimens only), is one component of a comprehensive MRSA colonization  surveillance program. It is not intended to diagnose MRSA infection nor to guide or monitor treatment for MRSA infections. RESULT CALLED TO, READ BACK BY AND VERIFIED WITH: G. RASUL [id number] 07.07.2018 N. MORRIS          Radiology Studies: Dg Chest 2 View  Result Date: 07/30/2016 CLINICAL DATA:  Syncope EXAM: CHEST  2 VIEW COMPARISON:  None. FINDINGS: There is no edema or consolidation. Heart is mildly enlarged with pulmonary vascularity within normal limits. There is a sizable hiatal type hernia. There is postoperative change in the midline cervical-thoracic junction region. There are old fractures of both humeral heads with extensive remodeling. Bones are osteoporotic. There is anterior wedging of a midthoracic vertebral body. IMPRESSION: No edema or consolidation. Heart mildly enlarged. Moderate hiatal hernia. Bones osteoporotic. Chronic fractures of both proximal humeri. Electronically Signed   By: Bretta Bang III M.D.   On: 07/30/2016 19:25   Ct Head Wo Contrast  Result Date: 07/30/2016 CLINICAL DATA:  71 year old female with acute syncope and headache. EXAM: CT HEAD WITHOUT CONTRAST TECHNIQUE: Contiguous axial images were obtained from the base of the skull through the vertex without intravenous contrast. COMPARISON:  None. FINDINGS: Brain: No evidence of acute infarction, hemorrhage, hydrocephalus, extra-axial collection or mass lesion/mass effect. Atrophy and moderate probable chronic small-vessel white matter ischemic changes noted. Vascular: Mild intracranial  atherosclerotic calcifications noted. Skull: Normal. Negative for fracture or focal lesion. Sinuses/Orbits: No acute finding. Other: None. IMPRESSION: No evidence of acute intracranial abnormality. Atrophy and moderate probable chronic small-vessel white matter ischemic changes. Electronically Signed   By: Harmon Pier M.D.   On: 07/30/2016 23:01   Ct Angio Chest Pe W Or Wo Contrast  Result Date: 07/30/2016 CLINICAL DATA:  Syncopal episode EXAM: CT ANGIOGRAPHY CHEST WITH CONTRAST TECHNIQUE: Multidetector CT imaging of the chest was performed using the standard protocol during bolus administration of intravenous contrast. Multiplanar CT image reconstructions and MIPs were obtained to evaluate the vascular anatomy. CONTRAST:  60 cc Isovue 370 IV COMPARISON:  Same day CXR. Prior CT exams of the chest and reports are not available on PACs. FINDINGS: Cardiovascular: Cardiomegaly without pericardial effusion. Coronary arteriosclerosis. 4.3 cm ascending aortic aneurysm. There is aortic atherosclerosis. No pulmonary embolus. Mediastinum/Nodes: Moderate to large sliding hiatal hernia. No lymphadenopathy. Evidence of prior thyroid surgery with what appears to be a large 3.6 cm heterogeneous left retroclavicular mass with differential considerations suggestive of residual thyroid goiter. Left paratracheal adenopathy is not entirely excluded. Correlation with prior reports or exams if available would prove useful. Lungs/Pleura: Faint ground-glass opacities within both lungs likely representing mild hypoventilatory change with atelectasis at the right lung base. No dominant mass, effusion or pneumothorax. Upper Abdomen: Cholecystectomy.  No adrenal mass. Musculoskeletal: Thoracolumbar spondylosis with midthoracic vertebra plana. Review of the MIP images confirms the above findings. IMPRESSION: 1. No acute pulmonary embolus. 2. 4.3 cm ascending aortic aneurysm without apparent dissection. Recommend annual imaging followup by  CTA or MRA. This recommendation follows 2010 ACCF/AHA/AATS/ACR/ASA/SCA/SCAI/SIR/STS/SVM Guidelines for the Diagnosis and Management of Patients with Thoracic Aortic Disease. Circulation. 2010; 121: Z610-R604 3. Cardiomegaly with coronary arteriosclerosis. 4. Status post prior thyroid surgery with what appears to be a 3.6 cm heterogeneous retroclavicular mass on the left that may represent residual goiter with differential possibilities that may include an enlarged lymph node. Correlate with prior report sent exams available. 5. Thoracic spondylosis with vertebral plana of the midthoracic vertebral body. Aortic Atherosclerosis (ICD10-I70.0). Electronically Signed   By: Rene Kocher.D.  On: 07/30/2016 23:20        Scheduled Meds: . ALPRAZolam  0.25 mg Oral Q12H  . aspirin EC  81 mg Oral Daily  . atorvastatin  10 mg Oral Daily  . Chlorhexidine Gluconate Cloth  6 each Topical Q0600  . clozapine  50 mg Oral QHS  . DULoxetine  60 mg Oral Daily  . enoxaparin (LOVENOX) injection  40 mg Subcutaneous Q24H  . guaiFENesin  600 mg Oral BID  . ipratropium  0.5 mg Nebulization Q6H  . levothyroxine  88 mcg Oral QAC breakfast  . metoprolol tartrate  25 mg Oral BID  . mometasone-formoterol  2 puff Inhalation BID  . mupirocin ointment  1 application Nasal BID  . sodium chloride flush  3 mL Intravenous Q12H  . sodium chloride flush  3 mL Intravenous Q12H  . traZODone  75 mg Oral QHS   Continuous Infusions: . sodium chloride Stopped (07/30/16 2359)  . cefTRIAXone (ROCEPHIN)  IV Stopped (07/30/16 2119)     LOS: 0 days    Time spent: 35 minutes.     Kathlen Mody, MD [facility] Pager [phone]  If 7PM-7AM, please contact night-coverage [email or web address] Password [email or web address] 07/31/2016, 11:09 AM

## 2016-07-31 NOTE — Progress Notes (Signed)
CRITICAL VALUE ALERT  Critical Value:  Lactic acid 3.1  Date & Time Notied:  07/31/16  12:16 pm  Provider Notified: Dr. Blake DivineAkula  Orders Received/Actions taken: 0.9 % NS @ 75 ml/hr. continuous

## 2016-08-01 ENCOUNTER — Inpatient Hospital Stay (HOSPITAL_COMMUNITY): Payer: Medicaid Other

## 2016-08-01 LAB — LACTIC ACID, PLASMA: LACTIC ACID, VENOUS: 1.9 mmol/L (ref 0.5–1.9)

## 2016-08-01 LAB — HEMOGLOBIN A1C
Hgb A1c MFr Bld: 5.3 % (ref 4.8–5.6)
Mean Plasma Glucose: 105 mg/dL

## 2016-08-01 MED ORDER — IPRATROPIUM-ALBUTEROL 0.5-2.5 (3) MG/3ML IN SOLN: 3.0000 mL | Freq: Four times a day (QID) | RESPIRATORY_TRACT | Status: DC | PRN

## 2016-08-01 MED ORDER — ACETAMINOPHEN 325 MG PO TABS
650.0000 mg | ORAL_TABLET | ORAL | Status: DC | PRN
  Administered 2016-08-01 – 2016-08-02 (×2): 650 mg via ORAL
  Filled 2016-08-01 (×2): qty 2

## 2016-08-01 NOTE — Progress Notes (Signed)
PROGRESS NOTE    Rilya Longo  ZOX:[id number] DOB: 31-Mar-1945 DOA: 07/30/2016 PCP: Patient, No Pcp Per   Brief Narrative: Nalda Shackleford is a 71 y.o. female with medical history significant of A.fib, COPD, GERD, schizoaffective disorder, CHF, bipolar, dementia. Had a syncopal episode while in the passenger seat of the car,. She was admitted for further evaluation.   Assessment & Plan:   Active Problems:   Syncope   COPD (chronic obstructive pulmonary disease) (HCC)   AF (paroxysmal atrial fibrillation) (HCC)   Hyponatremia   Lactic acidosis   Anemia   GERD (gastroesophageal reflux disease)   Dehydration   AKI (acute kidney injury) (HCC)   Acute lower UTI   Chest pain   Syncope:  Vaso vagal possibly from dehydration and hypotension. CT chest ruled out PE.  Elevated lactic acid on admission, trend lactic acid. Lactic acid normalized.  Still orthostatic, but without any symptoms.  Continue gentle hydration and therapy evaluation  Further work up with serial troponin's, echocardiogram and carotid duplex.  Carotid duplex does not showany sig stenosis.  Echocardiogram shows grade 1 diastolic dysfunction.  Serial troponins are negative.  Repeat orthostatics in am.    Atrial fibrillation: paroxysmal, not on anti coagulation, secondary to GI bleed.  CHA2DS2 score is 4. On aspirin, which will be continued.    Hyponatremia: secondary to dehydration.  Hydrate and repeat is normal.    Hypomagnesemia: will be repleted.    COPD:   no wheezing heard. Resume dulera.    Hypothyroidism;  Resume synthroid. TSH wnl.    Acute kidney injury: secondary to pre renal azotemia, from dehydration.  Repeat levels have improved.  Continue with gentle hydration.    GERD: STABLE.    4.3 cm ascending aortic aneurysm without apparent dissection. Recommend annual follow up with CTA OR MRI.   3.6 cm heterogeneous retroclavicular mass on the left:  IR consulted for biopsy, recommended  getting an [place] first.  US of the soft tissues of the neck ordered.   Abnormal UA/ UTI; urine cultures sent.  On rocephin.  Blood cultures also sent and pending.   Mild normocytic anemia;  Hemoglobin stable around 11.5  Hyperlipidemia:  Resume statin.   DVT prophylaxis: (Lovenox) Code Status: (Full) Family Communication: discussed with sister over the phone.  Disposition Plan: back to SNF tomorrow.   Consultants:   none  Procedures:  Echo US duplex o f the carotids.   Antimicrobials:rocephin.   Subjective: No new complaints.  No nausea, or dizziness.   Objective: Vitals:   07/31/16 2131 07/31/16 2133 08/01/16 0642 08/01/16 1010  BP: (!) 152/87 (!) 160/97 (!) 165/71 (!) 146/82  Pulse: 89 86 99 73  Resp: 20   18  Temp: 98.1 F (36.7 C)  (!) 97.5 F (36.4 C)   TempSrc: Oral  Oral   SpO2:   95% 100%  Weight:   77.9 kg (171 lb 12.8 oz)   Height:        Intake/Output Summary (Last 24 hours) at 08/01/16 1304 Last data filed at 08/01/16 0900  Gross per 24 hour  Intake             1740 ml  Output             1500 ml  Net              240 ml   Filed Weights   07/30/16 1445 07/30/16 2158 08/01/16 0642  Weight: 78.9 kg (174 lb) 77.5 kg (  170 lb 12.8 oz) 77.9 kg (171 lb 12.8 oz)    Examination:  General exam: Appears calm and comfortable , not in disress.  Respiratory system: Clear to auscultation. Respiratory effort normal. No wheezing or rhonchi.  Cardiovascular system: S1 & S2 heard, RRR. No JVD, murmurs, rubs, gallops or clicks.  Gastrointestinal system: Abdomen is nondistended, soft and nontender. No organomegaly or masses felt. Normal bowel sounds heard. Central nervous system: Alert and appears oriented. , non focal,  Extremities: Symmetric 5 x 5 power. Skin: No rashes, lesions or ulcers Psychiatry:Mood & affect appropriate.     Data Reviewed: I have personally reviewed following labs and imaging studies  CBC:  Recent Labs Lab 07/30/16 1438  07/31/16 0644  WBC 7.8 8.0  NEUTROABS 4.6  --   HGB 10.1* 11.5*  HCT 32.1* 37.1  MCV 82.3 83.0  PLT 182 174   Basic Metabolic Panel:  Recent Labs Lab 07/30/16 1438 07/31/16 0644  NA 131* 137  K 3.8 3.8  CL 97* 104  CO2 20* 20*  GLUCOSE 178* 96  BUN 12 7  CREATININE 1.28* 0.73  CALCIUM 9.1 9.0  MG  --  1.6*  PHOS  --  3.0   GFR: Estimated Creatinine Clearance: 63.3 mL/min (by C-G formula based on SCr of 0.73 mg/dL). Liver Function Tests:  Recent Labs Lab 07/30/16 1438 07/31/16 0644  AST 27 20  ALT 14 14  ALKPHOS 61 60  BILITOT 0.7 0.4  PROT 6.3* 6.2*  ALBUMIN 3.6 3.5   No results for input(s): LIPASE, AMYLASE in the last 168 hours. No results for input(s): AMMONIA in the last 168 hours. Coagulation Profile: No results for input(s): INR, PROTIME in the last 168 hours. Cardiac Enzymes:  Recent Labs Lab 07/30/16 2118 07/31/16 0137 07/31/16 0644  TROPONINI <0.03 <0.03 <0.03   BNP (last 3 results) No results for input(s): PROBNP in the last 8760 hours. HbA1C:  Recent Labs  07/31/16 0137  HGBA1C 5.3   CBG:  Recent Labs Lab 07/31/16 0632  GLUCAP 97   Lipid Profile: No results for input(s): CHOL, HDL, LDLCALC, TRIG, CHOLHDL, LDLDIRECT in the last 72 hours. Thyroid Function Tests:  Recent Labs  07/31/16 0137  TSH 0.684   Anemia Panel:  Recent Labs  07/31/16 0137 07/31/16 0644  VITAMINB12  --  727  FOLATE 11.3  --   FERRITIN  --  19  TIBC  --  343  IRON  --  47  RETICCTPCT  --  1.1   Sepsis Labs:  Recent Labs Lab 07/30/16 1836 07/30/16 2128 07/31/16 1116 08/01/16 1035  LATICACIDVEN 2.15* 2.54* 3.1* 1.9    Recent Results (from the past 240 hour(s))  Culture, blood (Routine X 2) w Reflex to ID Panel     Status: None (Preliminary result)   Collection Time: 07/30/16  3:40 PM  Result Value Ref Range Status   Specimen Description BLOOD LEFT ANTECUBITAL  Final   Special Requests IN PEDIATRIC BOTTLE Blood Culture adequate volume   Final   Culture NO GROWTH 2 DAYS  Final   Report Status PENDING  Incomplete  Culture, blood (Routine X 2) w Reflex to ID Panel     Status: None (Preliminary result)   Collection Time: 07/30/16  3:52 PM  Result Value Ref Range Status   Specimen Description BLOOD LEFT WRIST  Final   Special Requests   Final    BOTTLES DRAWN AEROBIC AND ANAEROBIC Blood Culture adequate volume   Culture  Setup Time  Final    GRAM POSITIVE COCCI IN CLUSTERS ANAEROBIC BOTTLE ONLY CRITICAL RESULT CALLED TO, READ BACK BY AND VERIFIED WITH: J MARKLE,PHARMD AT [id number] 07/31/16 BY L BENFIELD    Culture   Final    GRAM POSITIVE COCCI CULTURE REINCUBATED FOR BETTER GROWTH    Report Status PENDING  Incomplete  Blood Culture ID Panel (Reflexed)     Status: Abnormal   Collection Time: 07/30/16  3:52 PM  Result Value Ref Range Status   Enterococcus species NOT DETECTED NOT DETECTED Final   Listeria monocytogenes NOT DETECTED NOT DETECTED Final   Staphylococcus species DETECTED (A) NOT DETECTED Final    Comment: Methicillin (oxacillin) resistant coagulase negative staphylococcus. Possible blood culture contaminant (unless isolated from more than one blood culture draw or clinical case suggests pathogenicity). No antibiotic treatment is indicated for blood  culture contaminants. CRITICAL RESULT CALLED TO, READ BACK BY AND VERIFIED WITH: J MARKLE,PHARMD AT [id number] 07/31/16 BY L BENFIELD    Staphylococcus aureus NOT DETECTED NOT DETECTED Final   Methicillin resistance DETECTED (A) NOT DETECTED Final    Comment: CRITICAL RESULT CALLED TO, READ BACK BY AND VERIFIED WITH: J MARKLE,PHARMD AT 1817 07/31/16 BY L BENFIELD    Streptococcus species NOT DETECTED NOT DETECTED Final   Streptococcus agalactiae NOT DETECTED NOT DETECTED Final   Streptococcus pneumoniae NOT DETECTED NOT DETECTED Final   Streptococcus pyogenes NOT DETECTED NOT DETECTED Final   Acinetobacter baumannii NOT DETECTED NOT DETECTED Final   Enterobacteriaceae  species NOT DETECTED NOT DETECTED Final   Enterobacter cloacae complex NOT DETECTED NOT DETECTED Final   Escherichia coli NOT DETECTED NOT DETECTED Final   Klebsiella oxytoca NOT DETECTED NOT DETECTED Final   Klebsiella pneumoniae NOT DETECTED NOT DETECTED Final   Proteus species NOT DETECTED NOT DETECTED Final   Serratia marcescens NOT DETECTED NOT DETECTED Final   Haemophilus influenzae NOT DETECTED NOT DETECTED Final   Neisseria meningitidis NOT DETECTED NOT DETECTED Final   Pseudomonas aeruginosa NOT DETECTED NOT DETECTED Final   Candida albicans NOT DETECTED NOT DETECTED Final   Candida glabrata NOT DETECTED NOT DETECTED Final   Candida krusei NOT DETECTED NOT DETECTED Final   Candida parapsilosis NOT DETECTED NOT DETECTED Final   Candida tropicalis NOT DETECTED NOT DETECTED Final  Urine Culture     Status: Abnormal (Preliminary result)   Collection Time: 07/30/16  4:20 PM  Result Value Ref Range Status   Specimen Description URINE, CATHETERIZED  Final   Special Requests NONE  Final   Culture (A)  Final    >=100,000 COLONIES/mL ESCHERICHIA COLI SUSCEPTIBILITIES TO FOLLOW    Report Status PENDING  Incomplete  MRSA PCR Screening     Status: Abnormal   Collection Time: 07/30/16 11:29 PM  Result Value Ref Range Status   MRSA by PCR POSITIVE (A) NEGATIVE Final    Comment:        The GeneXpert MRSA Assay (FDA approved for NASAL specimens only), is one component of a comprehensive MRSA colonization surveillance program. It is not intended to diagnose MRSA infection nor to guide or monitor treatment for MRSA infections. RESULT CALLED TO, READ BACK BY AND VERIFIED WITH: G. RASUL [id number] 07.07.2018 N. MORRIS          Radiology Studies: Dg Chest 2 View  Result Date: 07/30/2016 CLINICAL DATA:  Syncope EXAM: CHEST  2 VIEW COMPARISON:  None. FINDINGS: There is no edema or consolidation. Heart is mildly enlarged with pulmonary vascularity within normal limits. There is a sizable  hiatal type hernia. There is postoperative change in the midline cervical-thoracic junction region. There are old fractures of both humeral heads with extensive remodeling. Bones are osteoporotic. There is anterior wedging of a midthoracic vertebral body. IMPRESSION: No edema or consolidation. Heart mildly enlarged. Moderate hiatal hernia. Bones osteoporotic. Chronic fractures of both proximal humeri. Electronically Signed   By: Bretta Bang III M.D.   On: 07/30/2016 19:25   Ct Head Wo Contrast  Result Date: 07/30/2016 CLINICAL DATA:  71 year old female with acute syncope and headache. EXAM: CT HEAD WITHOUT CONTRAST TECHNIQUE: Contiguous axial images were obtained from the base of the skull through the vertex without intravenous contrast. COMPARISON:  None. FINDINGS: Brain: No evidence of acute infarction, hemorrhage, hydrocephalus, extra-axial collection or mass lesion/mass effect. Atrophy and moderate probable chronic small-vessel white matter ischemic changes noted. Vascular: Mild intracranial atherosclerotic calcifications noted. Skull: Normal. Negative for fracture or focal lesion. Sinuses/Orbits: No acute finding. Other: None. IMPRESSION: No evidence of acute intracranial abnormality. Atrophy and moderate probable chronic small-vessel white matter ischemic changes. Electronically Signed   By: Harmon Pier M.D.   On: 07/30/2016 23:01   Ct Angio Chest Pe W Or Wo Contrast  Result Date: 07/30/2016 CLINICAL DATA:  Syncopal episode EXAM: CT ANGIOGRAPHY CHEST WITH CONTRAST TECHNIQUE: Multidetector CT imaging of the chest was performed using the standard protocol during bolus administration of intravenous contrast. Multiplanar CT image reconstructions and MIPs were obtained to evaluate the vascular anatomy. CONTRAST:  60 cc Isovue 370 IV COMPARISON:  Same day CXR. Prior CT exams of the chest and reports are not available on PACs. FINDINGS: Cardiovascular: Cardiomegaly without pericardial effusion. Coronary  arteriosclerosis. 4.3 cm ascending aortic aneurysm. There is aortic atherosclerosis. No pulmonary embolus. Mediastinum/Nodes: Moderate to large sliding hiatal hernia. No lymphadenopathy. Evidence of prior thyroid surgery with what appears to be a large 3.6 cm heterogeneous left retroclavicular mass with differential considerations suggestive of residual thyroid goiter. Left paratracheal adenopathy is not entirely excluded. Correlation with prior reports or exams if available would prove useful. Lungs/Pleura: Faint ground-glass opacities within both lungs likely representing mild hypoventilatory change with atelectasis at the right lung base. No dominant mass, effusion or pneumothorax. Upper Abdomen: Cholecystectomy.  No adrenal mass. Musculoskeletal: Thoracolumbar spondylosis with midthoracic vertebra plana. Review of the MIP images confirms the above findings. IMPRESSION: 1. No acute pulmonary embolus. 2. 4.3 cm ascending aortic aneurysm without apparent dissection. Recommend annual imaging followup by CTA or MRA. This recommendation follows 2010 ACCF/AHA/AATS/ACR/ASA/SCA/SCAI/SIR/STS/SVM Guidelines for the Diagnosis and Management of Patients with Thoracic Aortic Disease. Circulation. 2010; 121: W098-J191 3. Cardiomegaly with coronary arteriosclerosis. 4. Status post prior thyroid surgery with what appears to be a 3.6 cm heterogeneous retroclavicular mass on the left that may represent residual goiter with differential possibilities that may include an enlarged lymph node. Correlate with prior report sent exams available. 5. Thoracic spondylosis with vertebral plana of the midthoracic vertebral body. Aortic Atherosclerosis (ICD10-I70.0). Electronically Signed   By: Tollie Eth M.D.   On: 07/30/2016 23:20        Scheduled Meds: . ALPRAZolam  0.25 mg Oral Q12H  . aspirin EC  81 mg Oral Daily  . atorvastatin  10 mg Oral Daily  . Chlorhexidine Gluconate Cloth  6 each Topical Q0600  . clozapine  50 mg Oral  QHS  . DULoxetine  60 mg Oral Daily  . enoxaparin (LOVENOX) injection  40 mg Subcutaneous Q24H  . guaiFENesin  600 mg Oral BID  . levothyroxine  88 mcg  Oral QAC breakfast  . metoprolol tartrate  25 mg Oral BID  . mometasone-formoterol  2 puff Inhalation BID  . mupirocin ointment  1 application Nasal BID  . sodium chloride flush  3 mL Intravenous Q12H  . sodium chloride flush  3 mL Intravenous Q12H  . traZODone  75 mg Oral QHS   Continuous Infusions: . sodium chloride Stopped (07/30/16 2359)  . sodium chloride 75 mL/hr at 08/01/16 1255  . cefTRIAXone (ROCEPHIN)  IV Stopped (07/31/16 2215)     LOS: 1 day    Time spent: 35 minutes.     Kathlen Mody, MD [facility] Pager [phone]  If 7PM-7AM, please contact night-coverage [email or web address] Password [email or web address] 08/01/2016, 1:04 PM

## 2016-08-01 NOTE — Progress Notes (Signed)
Patient alert but confused at times. Purewick inserted and performed peri-care. Patient tolerated. Bed in the lowest position. Bed alarm activated for safety precaution. Denies any pain. No apparent distress noted. Will continue to monitor.

## 2016-08-01 NOTE — Plan of Care (Signed)
Problem: Safety: Goal: Ability to remain free from injury will improve Outcome: Progressing Safety precautions maintained, patient is on low bwd, alarm is on  Problem: Pain Managment: Goal: General experience of comfort will improve Outcome: Progressing Denies pain  Problem: Skin Integrity: Goal: Risk for impaired skin integrity will decrease Outcome: Not Progressing Scatted bruises noted all over arms, patient has skin tears to left arm covered with foam  Problem: Tissue Perfusion: Goal: Risk factors for ineffective tissue perfusion will decrease Outcome: Progressing SCDs are on, no signs of DVT noted  Problem: Activity: Goal: Capacity to carry out activities will improve Outcome: Progressing Out of bed to chair once with mild shortness of breath but tolerated well  Problem: Cardiac: Goal: Ability to achieve and maintain adequate cardiopulmonary perfusion will improve mild shortness of breath on activity but stable   Problem: Education: Goal: Ability to verbalize understanding of medication therapies will improve Outcome: Not Progressing Refused education

## 2016-08-02 LAB — VAS US CAROTID
LCCADDIAS: -21 cm/s
LEFT VERTEBRAL DIAS: -9 cm/s
LICAPSYS: -120 cm/s
Left CCA dist sys: -59 cm/s
Left CCA prox dias: 18 cm/s
Left CCA prox sys: 56 cm/s
Left ICA prox dias: -53 cm/s
RCCAPDIAS: 16 cm/s
RCCAPSYS: 46 cm/s
RIGHT VERTEBRAL DIAS: -38 cm/s
Right cca dist sys: -143 cm/s

## 2016-08-02 LAB — CULTURE, BLOOD (ROUTINE X 2): SPECIAL REQUESTS: ADEQUATE

## 2016-08-02 LAB — URINE CULTURE

## 2016-08-02 LAB — GLUCOSE, CAPILLARY: GLUCOSE-CAPILLARY: 96 mg/dL (ref 65–99)

## 2016-08-02 MED ORDER — CEPHALEXIN 500 MG PO CAPS: 500.0000 mg | ORAL_CAPSULE | Freq: Two times a day (BID) | ORAL | 0 refills | Status: AC

## 2016-08-02 MED ORDER — ALPRAZOLAM 0.25 MG PO TABS: 0.2500 mg | ORAL_TABLET | Freq: Two times a day (BID) | ORAL | 0 refills | Status: AC

## 2016-08-02 NOTE — Progress Notes (Signed)
Multiple calls placed to ED/security to request location of patient's shoes, no answer with ED security, and first person on the phone was unable to offer any information aside from request to verify shoes were documented present on arrival.

## 2016-08-02 NOTE — Progress Notes (Addendum)
Call placed to CCMD to notify of telemetry monitoring d/c. VS WNL. Pt is c/a/o at baseline. Per CSW pts sister is en route to pick her up, at around 1-130 to bring her to the facility from which she came.   Discharge paperwork has been prepared, awaiting sisters arrival for signature.   Pt has no shoes to wear home, she is dressed.

## 2016-08-02 NOTE — Progress Notes (Signed)
OT Cancellation Note  Patient Details Name: Carmelina PaddockHelen Mae Valbuena MRN: [id number] DOB: 11/21/1945   Cancelled Treatment:    Reason Eval/Treat Not Completed: OT screened, no needs identified, will sign off (defer to SNF venue awaiting transport)  Yetta BarreJones, Jetty PeeksJessica B   Odetta Forness, Brynn   OTR/L Pager: [phone] Office: [phone] .  08/02/2016, 11:56 AM

## 2016-08-02 NOTE — Discharge Summary (Signed)
Physician Discharge Summary  Angelica Robinson ZOX:[id number] DOB: November 19, 1945 DOA: 07/30/2016  PCP: Patient, No Pcp Per  Admit date: 07/30/2016 Discharge date: 08/02/2016  Admitted From:snf. Disposition:  SNF  Recommendations for Outpatient Follow-up:  1. Follow up with PCP in 1-2 weeks 2. Please obtain BMP/CBC in one week Please follow up with PCP regarding the thyroid nodule.  Recommend annual follow up with CTA OR MRI for ascending aortic aneurysm   Discharge Condition:stable.  CODE STATUS:* full code.  Diet recommendation: Heart Healthy   Brief/Interim Summary: Angelica Manternach Scottis a 71 y.o.femalewith medical history significant of A.fib, COPD, GERD, schizoaffective disorder, CHF, bipolar, dementia. Had a syncopal episode while in the passenger seat of the car,. She was admitted for further evaluation.   Discharge Diagnoses:  Active Problems:   Syncope   COPD (chronic obstructive pulmonary disease) (HCC)   AF (paroxysmal atrial fibrillation) (HCC)   Hyponatremia   Lactic acidosis   Anemia   GERD (gastroesophageal reflux disease)   Dehydration   AKI (acute kidney injury) (HCC)   Acute lower UTI   Chest pain Syncope:  Vaso vagal possibly from dehydration and hypotension. CT chest ruled out PE.  Elevated lactic acid on admission, trend lactic acid. Lactic acid normalized.  Further work up with serial troponin's, echocardiogram and carotid duplex.  Carotid duplex does not showany sig stenosis.  Echocardiogram shows grade 1 diastolic dysfunction.  Serial troponins are negative.  Pt currently denies any sob or chest pain or dizziness.    Atrial fibrillation: paroxysmal, not on anti coagulation, secondary to GI bleed.  CHA2DS2 score is 4. On aspirin, which will be continued.    Hyponatremia: secondary to dehydration.  Hydrate and repeat is normal.    Hypomagnesemia:  repleted.    COPD:   no wheezing heard. Resume dulera.    Hypothyroidism;  Resume synthroid.  TSH wnl.    Acute kidney injury: secondary to pre renal azotemia, from dehydration.  Repeat levels have improved.     GERD: STABLE.    4.3 cm ascending aortic aneurysm without apparent dissection. Recommend annual follow up with CTA OR MRI.   3.6 cm heterogeneous retroclavicular mass on the left:  IR consulted for biopsy, recommended getting an [place] first.  US of the soft tissues of the neck ordered, showed that its possibly a thyroid tissue.   E coli UTI:  Complete the course with keflex.   Mild normocytic anemia;  Hemoglobin stable around 11.5  Hyperlipidemia:  Resume statin.    Discharge Instructions  Discharge Instructions    Diet - low sodium heart healthy    Complete by:  As directed    Discharge instructions    Complete by:  As directed    Please follow up with PCP in one week.     Allergies as of 08/02/2016      Reactions   Oxycodone-acetaminophen Other (See Comments), Rash   SHORTNESS OF BREATH   Codeine Rash, Swelling   Fentanyl Other (See Comments)   LOST MEMORY, DIZZINESS   Simvastatin Nausea And Vomiting   Leg cramps   Sulfa Antibiotics Nausea And Vomiting, Rash   Vomiting and  rash   Doxycycline    Hctz [hydrochlorothiazide]    Nsaids    Percogesic [diphenhydramine-acetaminophen]    Valsartan    Losartan Rash   Tetracyclines & Related Rash      Medication List    STOP taking these medications   bisacodyl 5 MG EC tablet Commonly known as:  DULCOLAX  diclofenac sodium 1 % Gel Commonly known as:  VOLTAREN   isosorbide mononitrate 30 MG 24 hr tablet Commonly known as:  IMDUR     TAKE these medications   acetaminophen 325 MG tablet Commonly known as:  TYLENOL Take 650 mg by mouth every 6 (six) hours as needed.   ALPRAZolam 0.25 MG tablet Commonly known as:  XANAX Take 1 tablet (0.25 mg total) by mouth every 12 (twelve) hours. What changed:  how much to take   aspirin EC 81 MG tablet Take 81 mg by mouth daily.    atorvastatin 10 MG tablet Commonly known as:  LIPITOR Take 10 mg by mouth daily.   cephALEXin 500 MG capsule Commonly known as:  KEFLEX Take 1 capsule (500 mg total) by mouth 2 (two) times daily.   clozapine 50 MG tablet Commonly known as:  CLOZARIL Take 50 mg by mouth at bedtime.   DULoxetine 60 MG capsule Commonly known as:  CYMBALTA Take 60 mg by mouth daily.   fluticasone 50 MCG/ACT nasal spray Commonly known as:  FLONASE Place 1 spray into both nostrils daily.   ipratropium-albuterol 0.5-2.5 (3) MG/3ML Soln Commonly known as:  DUONEB Inhale 3 mLs into the lungs every 6 (six) hours as needed.   levothyroxine 88 MCG tablet Commonly known as:  SYNTHROID, LEVOTHROID Take 88 mcg by mouth daily.   metoprolol tartrate 50 MG tablet Commonly known as:  LOPRESSOR Take 50 mg by mouth daily.   ondansetron 4 MG tablet Commonly known as:  ZOFRAN Take 4 mg by mouth every 6 (six) hours as needed for nausea or vomiting.   pantoprazole 40 MG tablet Commonly known as:  PROTONIX Take 40 mg by mouth 2 (two) times daily.   polyethylene glycol packet Commonly known as:  MIRALAX / GLYCOLAX Take 17 g by mouth 2 (two) times daily as needed.   rizatriptan 5 MG tablet Commonly known as:  MAXALT Take 5 mg by mouth as needed for migraine. May repeat in 2 hours if needed   SYMBICORT 160-4.5 MCG/ACT inhaler Generic drug:  budesonide-formoterol Inhale 2 puffs into the lungs daily.   tiotropium 18 MCG inhalation capsule Commonly known as:  SPIRIVA Place 18 mcg into inhaler and inhale daily.   traZODone 50 MG tablet Commonly known as:  DESYREL Take 75 mg by mouth at bedtime.   vitamin B-12 500 MCG tablet Commonly known as:  CYANOCOBALAMIN Take 500 mcg by mouth daily.       Allergies  Allergen Reactions  . Oxycodone-Acetaminophen Other (See Comments) and Rash    SHORTNESS OF BREATH  . Codeine Rash and Swelling  . Fentanyl Other (See Comments)    LOST MEMORY, DIZZINESS  .  Simvastatin Nausea And Vomiting    Leg cramps  . Sulfa Antibiotics Nausea And Vomiting and Rash    Vomiting and  rash  . Doxycycline   . Hctz [Hydrochlorothiazide]   . Nsaids   . Percogesic [Diphenhydramine-Acetaminophen]   . Valsartan   . Losartan Rash  . Tetracyclines & Related Rash    Consultations:  None.    Procedures/Studies: Dg Chest 2 View  Result Date: 07/30/2016 CLINICAL DATA:  Syncope EXAM: CHEST  2 VIEW COMPARISON:  None. FINDINGS: There is no edema or consolidation. Heart is mildly enlarged with pulmonary vascularity within normal limits. There is a sizable hiatal type hernia. There is postoperative change in the midline cervical-thoracic junction region. There are old fractures of both humeral heads with extensive remodeling. Bones are osteoporotic.  There is anterior wedging of a midthoracic vertebral body. IMPRESSION: No edema or consolidation. Heart mildly enlarged. Moderate hiatal hernia. Bones osteoporotic. Chronic fractures of both proximal humeri. Electronically Signed   By: Bretta Bang III M.D.   On: 07/30/2016 19:25   Ct Head Wo Contrast  Result Date: 07/30/2016 CLINICAL DATA:  71 year old female with acute syncope and headache. EXAM: CT HEAD WITHOUT CONTRAST TECHNIQUE: Contiguous axial images were obtained from the base of the skull through the vertex without intravenous contrast. COMPARISON:  None. FINDINGS: Brain: No evidence of acute infarction, hemorrhage, hydrocephalus, extra-axial collection or mass lesion/mass effect. Atrophy and moderate probable chronic small-vessel white matter ischemic changes noted. Vascular: Mild intracranial atherosclerotic calcifications noted. Skull: Normal. Negative for fracture or focal lesion. Sinuses/Orbits: No acute finding. Other: None. IMPRESSION: No evidence of acute intracranial abnormality. Atrophy and moderate probable chronic small-vessel white matter ischemic changes. Electronically Signed   By: Harmon Pier M.D.   On:  07/30/2016 23:01   Ct Angio Chest Pe W Or Wo Contrast  Result Date: 07/30/2016 CLINICAL DATA:  Syncopal episode EXAM: CT ANGIOGRAPHY CHEST WITH CONTRAST TECHNIQUE: Multidetector CT imaging of the chest was performed using the standard protocol during bolus administration of intravenous contrast. Multiplanar CT image reconstructions and MIPs were obtained to evaluate the vascular anatomy. CONTRAST:  60 cc Isovue 370 IV COMPARISON:  Same day CXR. Prior CT exams of the chest and reports are not available on PACs. FINDINGS: Cardiovascular: Cardiomegaly without pericardial effusion. Coronary arteriosclerosis. 4.3 cm ascending aortic aneurysm. There is aortic atherosclerosis. No pulmonary embolus. Mediastinum/Nodes: Moderate to large sliding hiatal hernia. No lymphadenopathy. Evidence of prior thyroid surgery with what appears to be a large 3.6 cm heterogeneous left retroclavicular mass with differential considerations suggestive of residual thyroid goiter. Left paratracheal adenopathy is not entirely excluded. Correlation with prior reports or exams if available would prove useful. Lungs/Pleura: Faint ground-glass opacities within both lungs likely representing mild hypoventilatory change with atelectasis at the right lung base. No dominant mass, effusion or pneumothorax. Upper Abdomen: Cholecystectomy.  No adrenal mass. Musculoskeletal: Thoracolumbar spondylosis with midthoracic vertebra plana. Review of the MIP images confirms the above findings. IMPRESSION: 1. No acute pulmonary embolus. 2. 4.3 cm ascending aortic aneurysm without apparent dissection. Recommend annual imaging followup by CTA or MRA. This recommendation follows 2010 ACCF/AHA/AATS/ACR/ASA/SCA/SCAI/SIR/STS/SVM Guidelines for the Diagnosis and Management of Patients with Thoracic Aortic Disease. Circulation. 2010; 121: Z610-R604 3. Cardiomegaly with coronary arteriosclerosis. 4. Status post prior thyroid surgery with what appears to be a 3.6 cm  heterogeneous retroclavicular mass on the left that may represent residual goiter with differential possibilities that may include an enlarged lymph node. Correlate with prior report sent exams available. 5. Thoracic spondylosis with vertebral plana of the midthoracic vertebral body. Aortic Atherosclerosis (ICD10-I70.0). Electronically Signed   By: Tollie Eth M.D.   On: 07/30/2016 23:20   US Soft Tissue Neck  Result Date: 08/01/2016 CLINICAL DATA:  Status post prior thyroidectomy in 2013. Recent CT of the chest demonstrates nodular soft tissue in the left superior mediastinum suspicious for residual thyroid tissue/goiter. EXAM: ULTRASOUND OF HEAD/NECK SOFT TISSUES TECHNIQUE: Ultrasound examination of the head and neck soft tissues was performed in the area of clinical concern. COMPARISON:  CTA of the chest on 07/30/2016 FINDINGS: Ultrasound demonstrates nodular tissue in the left lower neck extending into the upper mediastinum and measuring approximately 3.5 x 3.7 x 3.1 cm. This tissue is similar to thyroid tissue in echogenicity. No focal mass or abnormal calcifications are  seen. There are a few small areas of central calcification within the tissue and findings likely relate to residual thyroid tissue. This has the appearance of part of a thyroid goiter that was not completely resected. IMPRESSION: Nodular tissue in the left lower neck extending into the upper left mediastinum and measuring approximately 3.5 x 3.7 x 3.1 cm. Sonographic appearance is consistent with thyroid tissue and most likely relates to residual thyroid tissue that was not completely resected at the time of thyroidectomy. Correlation suggested with prior operative records and any previous postoperative ultrasound or CT studies demonstrating residual thyroid tissue. Electronically Signed   By: Irish LackGlenn  Yamagata M.D.   On: 08/01/2016 16:23       Subjective: No chest pain or sob. No nausea, or vomiting  no abdominal pain No headache or  dizziness.   Discharge Exam: Vitals:   08/01/16 1953 08/02/16 0711  BP: 134/84 128/78  Pulse: 70 70  Resp: 17 18  Temp: 97.7 F (36.5 C) 98 F (36.7 C)   Vitals:   08/01/16 1257 08/01/16 1303 08/01/16 1953 08/02/16 0711  BP:   134/84 128/78  Pulse: 75  70 70  Resp:   17 18  Temp:   97.7 F (36.5 C) 98 F (36.7 C)  TempSrc:   Oral Oral  SpO2:  96% 100% 100%  Weight:    77.3 kg (170 lb 6.4 oz)  Height:        General: Pt is alert, awake, not in acute distress Cardiovascular: RRR, S1/S2 +, no rubs, no gallops Respiratory: CTA bilaterally, no wheezing, no rhonchi Abdominal: Soft, NT, ND, bowel sounds + Extremities: no edema, no cyanosis    The results of significant diagnostics from this hospitalization (including imaging, microbiology, ancillary and laboratory) are listed below for reference.     Microbiology: Recent Results (from the past 240 hour(s))  Culture, blood (Routine X 2) w Reflex to ID Panel     Status: None (Preliminary result)   Collection Time: 07/30/16  3:40 PM  Result Value Ref Range Status   Specimen Description BLOOD LEFT ANTECUBITAL  Final   Special Requests IN PEDIATRIC BOTTLE Blood Culture adequate volume  Final   Culture NO GROWTH 2 DAYS  Final   Report Status PENDING  Incomplete  Culture, blood (Routine X 2) w Reflex to ID Panel     Status: Abnormal   Collection Time: 07/30/16  3:52 PM  Result Value Ref Range Status   Specimen Description BLOOD LEFT WRIST  Final   Special Requests   Final    BOTTLES DRAWN AEROBIC AND ANAEROBIC Blood Culture adequate volume   Culture  Setup Time   Final    GRAM POSITIVE COCCI IN CLUSTERS ANAEROBIC BOTTLE ONLY CRITICAL RESULT CALLED TO, READ BACK BY AND VERIFIED WITH: J MARKLE,PHARMD AT 1817 07/31/16 BY L BENFIELD    Culture (A)  Final    STAPHYLOCOCCUS SPECIES (COAGULASE NEGATIVE) THE SIGNIFICANCE OF ISOLATING THIS ORGANISM FROM A SINGLE SET OF BLOOD CULTURES WHEN MULTIPLE SETS ARE DRAWN IS UNCERTAIN. PLEASE  NOTIFY THE MICROBIOLOGY DEPARTMENT WITHIN ONE WEEK IF SPECIATION AND SENSITIVITIES ARE REQUIRED.    Report Status 08/02/2016 FINAL  Final  Blood Culture ID Panel (Reflexed)     Status: Abnormal   Collection Time: 07/30/16  3:52 PM  Result Value Ref Range Status   Enterococcus species NOT DETECTED NOT DETECTED Final   Listeria monocytogenes NOT DETECTED NOT DETECTED Final   Staphylococcus species DETECTED (A) NOT DETECTED Final  Comment: Methicillin (oxacillin) resistant coagulase negative staphylococcus. Possible blood culture contaminant (unless isolated from more than one blood culture draw or clinical case suggests pathogenicity). No antibiotic treatment is indicated for blood  culture contaminants. CRITICAL RESULT CALLED TO, READ BACK BY AND VERIFIED WITH: J MARKLE,PHARMD AT 1610 07/31/16 BY L BENFIELD    Staphylococcus aureus NOT DETECTED NOT DETECTED Final   Methicillin resistance DETECTED (A) NOT DETECTED Final    Comment: CRITICAL RESULT CALLED TO, READ BACK BY AND VERIFIED WITH: J MARKLE,PHARMD AT 1817 07/31/16 BY L BENFIELD    Streptococcus species NOT DETECTED NOT DETECTED Final   Streptococcus agalactiae NOT DETECTED NOT DETECTED Final   Streptococcus pneumoniae NOT DETECTED NOT DETECTED Final   Streptococcus pyogenes NOT DETECTED NOT DETECTED Final   Acinetobacter baumannii NOT DETECTED NOT DETECTED Final   Enterobacteriaceae species NOT DETECTED NOT DETECTED Final   Enterobacter cloacae complex NOT DETECTED NOT DETECTED Final   Escherichia coli NOT DETECTED NOT DETECTED Final   Klebsiella oxytoca NOT DETECTED NOT DETECTED Final   Klebsiella pneumoniae NOT DETECTED NOT DETECTED Final   Proteus species NOT DETECTED NOT DETECTED Final   Serratia marcescens NOT DETECTED NOT DETECTED Final   Haemophilus influenzae NOT DETECTED NOT DETECTED Final   Neisseria meningitidis NOT DETECTED NOT DETECTED Final   Pseudomonas aeruginosa NOT DETECTED NOT DETECTED Final   Candida albicans  NOT DETECTED NOT DETECTED Final   Candida glabrata NOT DETECTED NOT DETECTED Final   Candida krusei NOT DETECTED NOT DETECTED Final   Candida parapsilosis NOT DETECTED NOT DETECTED Final   Candida tropicalis NOT DETECTED NOT DETECTED Final  Urine Culture     Status: Abnormal   Collection Time: 07/30/16  4:20 PM  Result Value Ref Range Status   Specimen Description URINE, CATHETERIZED  Final   Special Requests NONE  Final   Culture >=100,000 COLONIES/mL ESCHERICHIA COLI (A)  Final   Report Status 08/02/2016 FINAL  Final   Organism ID, Bacteria ESCHERICHIA COLI (A)  Final      Susceptibility   Escherichia coli - MIC*    AMPICILLIN >=32 RESISTANT Resistant     CEFAZOLIN 8 SENSITIVE Sensitive     CEFTRIAXONE <=1 SENSITIVE Sensitive     CIPROFLOXACIN >=4 RESISTANT Resistant     GENTAMICIN <=1 SENSITIVE Sensitive     IMIPENEM <=0.25 SENSITIVE Sensitive     NITROFURANTOIN <=16 SENSITIVE Sensitive     TRIMETH/SULFA >=320 RESISTANT Resistant     AMPICILLIN/SULBACTAM >=32 RESISTANT Resistant     PIP/TAZO 8 SENSITIVE Sensitive     Extended ESBL NEGATIVE Sensitive     * >=100,000 COLONIES/mL ESCHERICHIA COLI  MRSA PCR Screening     Status: Abnormal   Collection Time: 07/30/16 11:29 PM  Result Value Ref Range Status   MRSA by PCR POSITIVE (A) NEGATIVE Final    Comment:        The GeneXpert MRSA Assay (FDA approved for NASAL specimens only), is one component of a comprehensive MRSA colonization surveillance program. It is not intended to diagnose MRSA infection nor to guide or monitor treatment for MRSA infections. RESULT CALLED TO, READ BACK BY AND VERIFIED WITH: G. RASUL [id number] 07.07.2018 N. MORRIS      Labs: BNP (last 3 results) No results for input(s): BNP in the last 8760 hours. Basic Metabolic Panel:  Recent Labs Lab 07/30/16 1438 07/31/16 0644  NA 131* 137  K 3.8 3.8  CL 97* 104  CO2 20* 20*  GLUCOSE 178* 96  BUN 12  7  CREATININE 1.28* 0.73  CALCIUM 9.1 9.0  MG   --  1.6*  PHOS  --  3.0   Liver Function Tests:  Recent Labs Lab 07/30/16 1438 07/31/16 0644  AST 27 20  ALT 14 14  ALKPHOS 61 60  BILITOT 0.7 0.4  PROT 6.3* 6.2*  ALBUMIN 3.6 3.5   No results for input(s): LIPASE, AMYLASE in the last 168 hours. No results for input(s): AMMONIA in the last 168 hours. CBC:  Recent Labs Lab 07/30/16 1438 07/31/16 0644  WBC 7.8 8.0  NEUTROABS 4.6  --   HGB 10.1* 11.5*  HCT 32.1* 37.1  MCV 82.3 83.0  PLT 182 174   Cardiac Enzymes:  Recent Labs Lab 07/30/16 2118 07/31/16 0137 07/31/16 0644  TROPONINI <0.03 <0.03 <0.03   BNP: Invalid input(s): POCBNP CBG:  Recent Labs Lab 07/31/16 0632 08/02/16 0707  GLUCAP 97 96   D-Dimer  Recent Labs  07/30/16 2050  DDIMER 3.45*   Hgb A1c  Recent Labs  07/31/16 0137  HGBA1C 5.3   Lipid Profile No results for input(s): CHOL, HDL, LDLCALC, TRIG, CHOLHDL, LDLDIRECT in the last 72 hours. Thyroid function studies  Recent Labs  07/31/16 0137  TSH 0.684   Anemia work up  Recent Labs  07/31/16 0137 07/31/16 0644  VITAMINB12  --  727  FOLATE 11.3  --   FERRITIN  --  19  TIBC  --  343  IRON  --  47  RETICCTPCT  --  1.1   Urinalysis    Component Value Date/Time   COLORURINE AMBER (A) 07/30/2016 1620   APPEARANCEUR CLOUDY (A) 07/30/2016 1620   LABSPEC 1.021 07/30/2016 1620   PHURINE 5.0 07/30/2016 1620   GLUCOSEU NEGATIVE 07/30/2016 1620   HGBUR NEGATIVE 07/30/2016 1620   BILIRUBINUR NEGATIVE 07/30/2016 1620   KETONESUR 5 (A) 07/30/2016 1620   PROTEINUR 30 (A) 07/30/2016 1620   NITRITE NEGATIVE 07/30/2016 1620   LEUKOCYTESUR SMALL (A) 07/30/2016 1620   Sepsis Labs Invalid input(s): PROCALCITONIN,  WBC,  LACTICIDVEN Microbiology Recent Results (from the past 240 hour(s))  Culture, blood (Routine X 2) w Reflex to ID Panel     Status: None (Preliminary result)   Collection Time: 07/30/16  3:40 PM  Result Value Ref Range Status   Specimen Description BLOOD LEFT  ANTECUBITAL  Final   Special Requests IN PEDIATRIC BOTTLE Blood Culture adequate volume  Final   Culture NO GROWTH 2 DAYS  Final   Report Status PENDING  Incomplete  Culture, blood (Routine X 2) w Reflex to ID Panel     Status: Abnormal   Collection Time: 07/30/16  3:52 PM  Result Value Ref Range Status   Specimen Description BLOOD LEFT WRIST  Final   Special Requests   Final    BOTTLES DRAWN AEROBIC AND ANAEROBIC Blood Culture adequate volume   Culture  Setup Time   Final    GRAM POSITIVE COCCI IN CLUSTERS ANAEROBIC BOTTLE ONLY CRITICAL RESULT CALLED TO, READ BACK BY AND VERIFIED WITH: J MARKLE,PHARMD AT 1817 07/31/16 BY L BENFIELD    Culture (A)  Final    STAPHYLOCOCCUS SPECIES (COAGULASE NEGATIVE) THE SIGNIFICANCE OF ISOLATING THIS ORGANISM FROM A SINGLE SET OF BLOOD CULTURES WHEN MULTIPLE SETS ARE DRAWN IS UNCERTAIN. PLEASE NOTIFY THE MICROBIOLOGY DEPARTMENT WITHIN ONE WEEK IF SPECIATION AND SENSITIVITIES ARE REQUIRED.    Report Status 08/02/2016 FINAL  Final  Blood Culture ID Panel (Reflexed)     Status: Abnormal   Collection  Time: 07/30/16  3:52 PM  Result Value Ref Range Status   Enterococcus species NOT DETECTED NOT DETECTED Final   Listeria monocytogenes NOT DETECTED NOT DETECTED Final   Staphylococcus species DETECTED (A) NOT DETECTED Final    Comment: Methicillin (oxacillin) resistant coagulase negative staphylococcus. Possible blood culture contaminant (unless isolated from more than one blood culture draw or clinical case suggests pathogenicity). No antibiotic treatment is indicated for blood  culture contaminants. CRITICAL RESULT CALLED TO, READ BACK BY AND VERIFIED WITH: J MARKLE,PHARMD AT 1610 07/31/16 BY L BENFIELD    Staphylococcus aureus NOT DETECTED NOT DETECTED Final   Methicillin resistance DETECTED (A) NOT DETECTED Final    Comment: CRITICAL RESULT CALLED TO, READ BACK BY AND VERIFIED WITH: J MARKLE,PHARMD AT 1817 07/31/16 BY L BENFIELD    Streptococcus species  NOT DETECTED NOT DETECTED Final   Streptococcus agalactiae NOT DETECTED NOT DETECTED Final   Streptococcus pneumoniae NOT DETECTED NOT DETECTED Final   Streptococcus pyogenes NOT DETECTED NOT DETECTED Final   Acinetobacter baumannii NOT DETECTED NOT DETECTED Final   Enterobacteriaceae species NOT DETECTED NOT DETECTED Final   Enterobacter cloacae complex NOT DETECTED NOT DETECTED Final   Escherichia coli NOT DETECTED NOT DETECTED Final   Klebsiella oxytoca NOT DETECTED NOT DETECTED Final   Klebsiella pneumoniae NOT DETECTED NOT DETECTED Final   Proteus species NOT DETECTED NOT DETECTED Final   Serratia marcescens NOT DETECTED NOT DETECTED Final   Haemophilus influenzae NOT DETECTED NOT DETECTED Final   Neisseria meningitidis NOT DETECTED NOT DETECTED Final   Pseudomonas aeruginosa NOT DETECTED NOT DETECTED Final   Candida albicans NOT DETECTED NOT DETECTED Final   Candida glabrata NOT DETECTED NOT DETECTED Final   Candida krusei NOT DETECTED NOT DETECTED Final   Candida parapsilosis NOT DETECTED NOT DETECTED Final   Candida tropicalis NOT DETECTED NOT DETECTED Final  Urine Culture     Status: Abnormal   Collection Time: 07/30/16  4:20 PM  Result Value Ref Range Status   Specimen Description URINE, CATHETERIZED  Final   Special Requests NONE  Final   Culture >=100,000 COLONIES/mL ESCHERICHIA COLI (A)  Final   Report Status 08/02/2016 FINAL  Final   Organism ID, Bacteria ESCHERICHIA COLI (A)  Final      Susceptibility   Escherichia coli - MIC*    AMPICILLIN >=32 RESISTANT Resistant     CEFAZOLIN 8 SENSITIVE Sensitive     CEFTRIAXONE <=1 SENSITIVE Sensitive     CIPROFLOXACIN >=4 RESISTANT Resistant     GENTAMICIN <=1 SENSITIVE Sensitive     IMIPENEM <=0.25 SENSITIVE Sensitive     NITROFURANTOIN <=16 SENSITIVE Sensitive     TRIMETH/SULFA >=320 RESISTANT Resistant     AMPICILLIN/SULBACTAM >=32 RESISTANT Resistant     PIP/TAZO 8 SENSITIVE Sensitive     Extended ESBL NEGATIVE  Sensitive     * >=100,000 COLONIES/mL ESCHERICHIA COLI  MRSA PCR Screening     Status: Abnormal   Collection Time: 07/30/16 11:29 PM  Result Value Ref Range Status   MRSA by PCR POSITIVE (A) NEGATIVE Final    Comment:        The GeneXpert MRSA Assay (FDA approved for NASAL specimens only), is one component of a comprehensive MRSA colonization surveillance program. It is not intended to diagnose MRSA infection nor to guide or monitor treatment for MRSA infections. RESULT CALLED TO, READ BACK BY AND VERIFIED WITH: G. RASUL [id number] 07.07.2018 N. MORRIS      Time coordinating discharge: Over 30 minutes  SIGNEDKathlen Mody, MD  [facility] 08/02/2016, 9:37 AM Pager   If 7PM-7AM, please contact night-coverage [email or web address] Password [email or web address]

## 2016-08-02 NOTE — Progress Notes (Signed)
[facility] [place]; Maralyn SagoSarah SW is aware and is following for discharge; Alexis GoodellB Errin Chewning RN,MHA,BSN [phone]

## 2016-08-02 NOTE — Progress Notes (Signed)
Pt had a restful after frequent request of fluids gave teaching on HF related to fluid over load with teach back, vitals stable

## 2016-08-02 NOTE — NC FL2 (Signed)
MEDICAID FL2 LEVEL OF CARE SCREENING TOOL     IDENTIFICATION  Patient Name: Angelica PaddockHelen Mae Robinson Birthdate: 01/13/1946 Sex: female Admission Date (Current Location): 07/30/2016  [facility] [place] [place]   Facility and Address:  [facility] [place], [address], [place], [place] [address]      Provider Number: [id number]  Attending Physician Name and Address:  Kathlen ModyAkula, Vijaya, MD  Relative Name and Phone Number:  Glo HerringJoyce Amos, [phone]    Current Level of Care: Hospital Recommended Level of Care: Nursing Facility Prior Approval Number:    Date Approved/Denied:   PASRR Number:    Discharge Plan: SNF    Current Diagnoses: Patient Active Problem List   Diagnosis Date Noted  . Syncope 07/30/2016  . COPD (chronic obstructive pulmonary disease) (HCC) 07/30/2016  . AF (paroxysmal atrial fibrillation) (HCC) 07/30/2016  . Hyponatremia 07/30/2016  . Lactic acidosis 07/30/2016  . Anemia 07/30/2016  . GERD (gastroesophageal reflux disease) 07/30/2016  . Dehydration 07/30/2016  . AKI (acute kidney injury) (HCC) 07/30/2016  . Acute lower UTI 07/30/2016  . Chest pain 07/30/2016    Orientation RESPIRATION BLADDER Height & Weight     Self, Time, Place, Situation  Normal Incontinent Weight: 170 lb 6.4 oz (77.3 kg) (Scale A) Height:  5\' 4"  (162.6 cm)  BEHAVIORAL SYMPTOMS/MOOD NEUROLOGICAL BOWEL NUTRITION STATUS      Continent Diet (DSY1)  AMBULATORY STATUS COMMUNICATION OF NEEDS Skin   Limited Assist Verbally Normal                       Personal Care Assistance Level of Assistance  Bathing, Feeding, Dressing Bathing Assistance: Limited assistance Feeding assistance: Independent Dressing Assistance: Limited assistance     Functional Limitations Info  Sight, Hearing, Speech Sight Info: Adequate Hearing Info: Adequate Speech Info: Adequate    SPECIAL CARE FACTORS FREQUENCY  PT (By licensed PT), OT (By licensed OT)     PT  Frequency: 5x k OT Frequency: 5x wk            Contractures Contractures Info: Not present    Additional Factors Info  Code Status, Allergies Code Status Info: Full Code Allergies Info: OXYCODONE-ACETAMINOPHEN, CODEINE, FENTANYL, SIMVASTATIN, SULFA ANTIBIOTICS, DOXYCYCLINE, HCTZ HYDROCHLOROTHIAZIDE, NSAIDS, PERCOGESIC DIPHENHYDRAMINE-ACETAMINOPHEN, VALSARTAN, LOSARTAN, TETRACYCLINES & RELATED           Current Medications (08/02/2016):  This is the current hospital active medication list Current Facility-Administered Medications  Medication Dose Route Frequency Provider Last Rate Last Dose  . 0.9 %  sodium chloride infusion   Intravenous Continuous Orson Slickolson, Andrew, MD   Stopped at 07/30/16 2359  . 0.9 %  sodium chloride infusion   Intravenous Continuous Kathlen ModyAkula, Vijaya, MD 75 mL/hr at 08/02/16 0335    . acetaminophen (TYLENOL) tablet 650 mg  650 mg Oral Q4H PRN Kathlen ModyAkula, Vijaya, MD   650 mg at 08/01/16 1434  . ALPRAZolam (XANAX) tablet 0.25 mg  0.25 mg Oral Q12H Doutova, Anastassia, MD   0.25 mg at 08/02/16 1105  . aspirin EC tablet 81 mg  81 mg Oral Daily Doutova, Anastassia, MD   81 mg at 08/02/16 1105  . atorvastatin (LIPITOR) tablet 10 mg  10 mg Oral Daily Doutova, Anastassia, MD   10 mg at 08/02/16 1106  . cefTRIAXone (ROCEPHIN) 1 g in dextrose 5 % 50 mL IVPB  1 g Intravenous Q24H Therisa Doyneoutova, Anastassia, MD   Stopped at 08/01/16 2300  . Chlorhexidine Gluconate Cloth 2 % PADS  6 each  6 each Topical Q0600 Kathlen Mody, MD   6 each at 08/01/16 [phone]  . cloZAPine (CLOZARIL) tablet 50 mg  50 mg Oral QHS Doutova, Anastassia, MD   50 mg at 08/01/16 2226  . DULoxetine (CYMBALTA) DR capsule 60 mg  60 mg Oral Daily Doutova, Anastassia, MD   60 mg at 08/02/16 1105  . enoxaparin (LOVENOX) injection 40 mg  40 mg Subcutaneous Q24H Stevphen Rochester, RPH   40 mg at 08/01/16 1011  . guaiFENesin (MUCINEX) 12 hr tablet 600 mg  600 mg Oral BID Therisa Doyne, MD   600 mg at 08/02/16 1106  .  ipratropium-albuterol (DUONEB) 0.5-2.5 (3) MG/3ML nebulizer solution 3 mL  3 mL Nebulization Q6H PRN Kathlen Mody, MD      . levothyroxine (SYNTHROID, LEVOTHROID) tablet 88 mcg  88 mcg Oral QAC breakfast Therisa Doyne, MD   88 mcg at 08/02/16 9604  . metoprolol tartrate (LOPRESSOR) tablet 25 mg  25 mg Oral BID Therisa Doyne, MD   25 mg at 08/02/16 1105  . mometasone-formoterol (DULERA) 200-5 MCG/ACT inhaler 2 puff  2 puff Inhalation BID Therisa Doyne, MD   2 puff at 08/02/16 1020  . mupirocin ointment (BACTROBAN) 2 % 1 application  1 application Nasal BID Kathlen Mody, MD   1 application at 08/02/16 1106  . ondansetron (ZOFRAN) tablet 4 mg  4 mg Oral Q6H PRN Therisa Doyne, MD   4 mg at 07/31/16 0950   Or  . ondansetron (ZOFRAN) injection 4 mg  4 mg Intravenous Q6H PRN Doutova, Anastassia, MD      . sodium chloride flush (NS) 0.9 % injection 3 mL  3 mL Intravenous Q12H Doutova, Anastassia, MD   3 mL at 07/31/16 0952  . sodium chloride flush (NS) 0.9 % injection 3 mL  3 mL Intravenous Q12H Doutova, Anastassia, MD   3 mL at 07/31/16 2144  . traZODone (DESYREL) tablet 75 mg  75 mg Oral QHS Therisa Doyne, MD   75 mg at 08/01/16 2226     Discharge Medications: Please see discharge summary for a list of discharge medications.  Relevant Imaging Results:  Relevant Lab Results:   Additional Information SS#[id number]  Althea Charon, LCSW

## 2016-08-02 NOTE — Progress Notes (Signed)
Clinical Social Worker met patient at bedside to discuss transportation back to her SNF in [place]. Patient stated she is from [facility] and has been at facility for 86yr Patient stated once she finish rehab at that facility her [place] Patient stated her sister JJoette Catchingwill be the one transporting her back to facility. CSW confirmed with patient sister JBlanch Mediavia phone. J[place]anch Mediast[place]. CSW signing off as patient has no more needs.   ARhea Pink MSW,  [place]

## 2016-08-04 LAB — CULTURE, BLOOD (ROUTINE X 2)
Culture: NO GROWTH
SPECIAL REQUESTS: ADEQUATE

## 2017-12-20 IMAGING — DX DG CHEST 2V
2 series · 2 of 2 positions shown · non-contrast
Comparison: None.

CLINICAL DATA: Syncope

EXAM:
CHEST  2 VIEW

[chest lat]
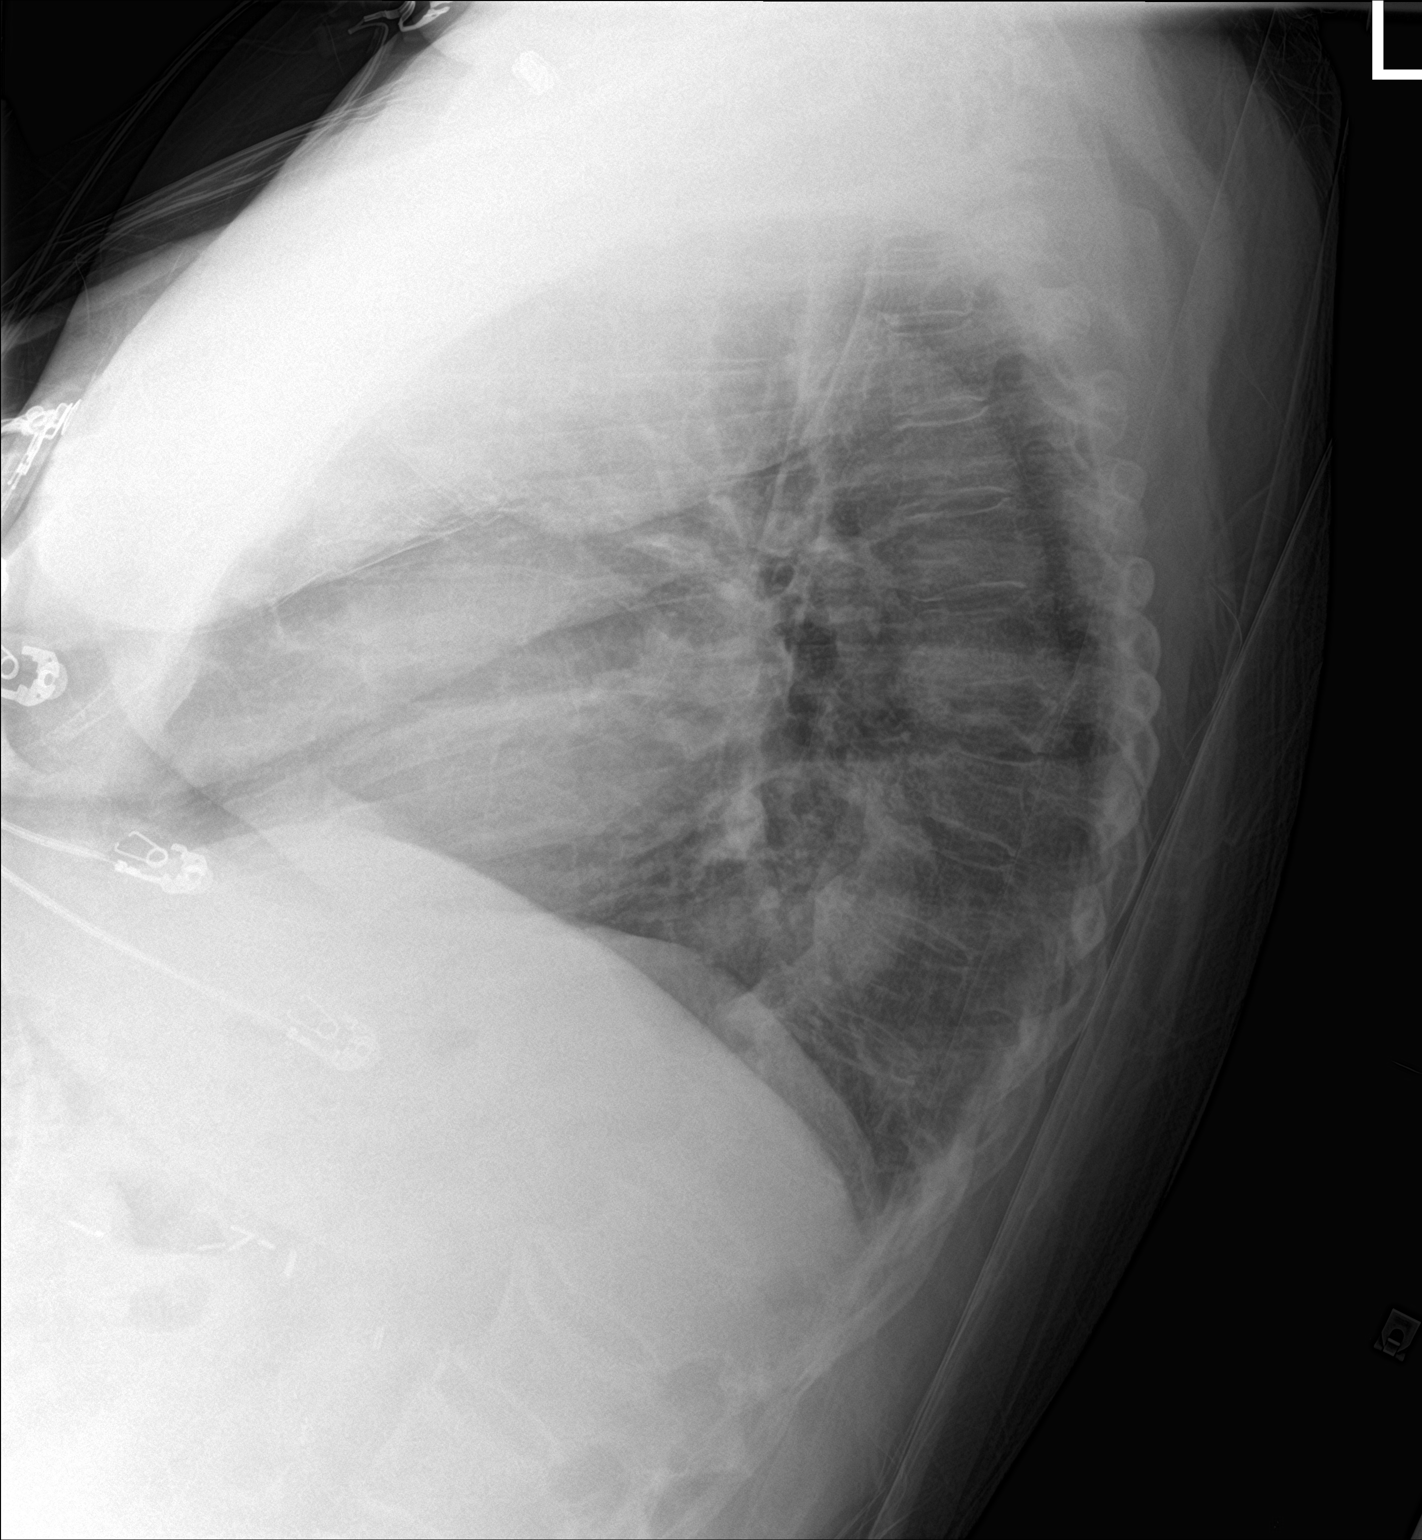

[chest ap]
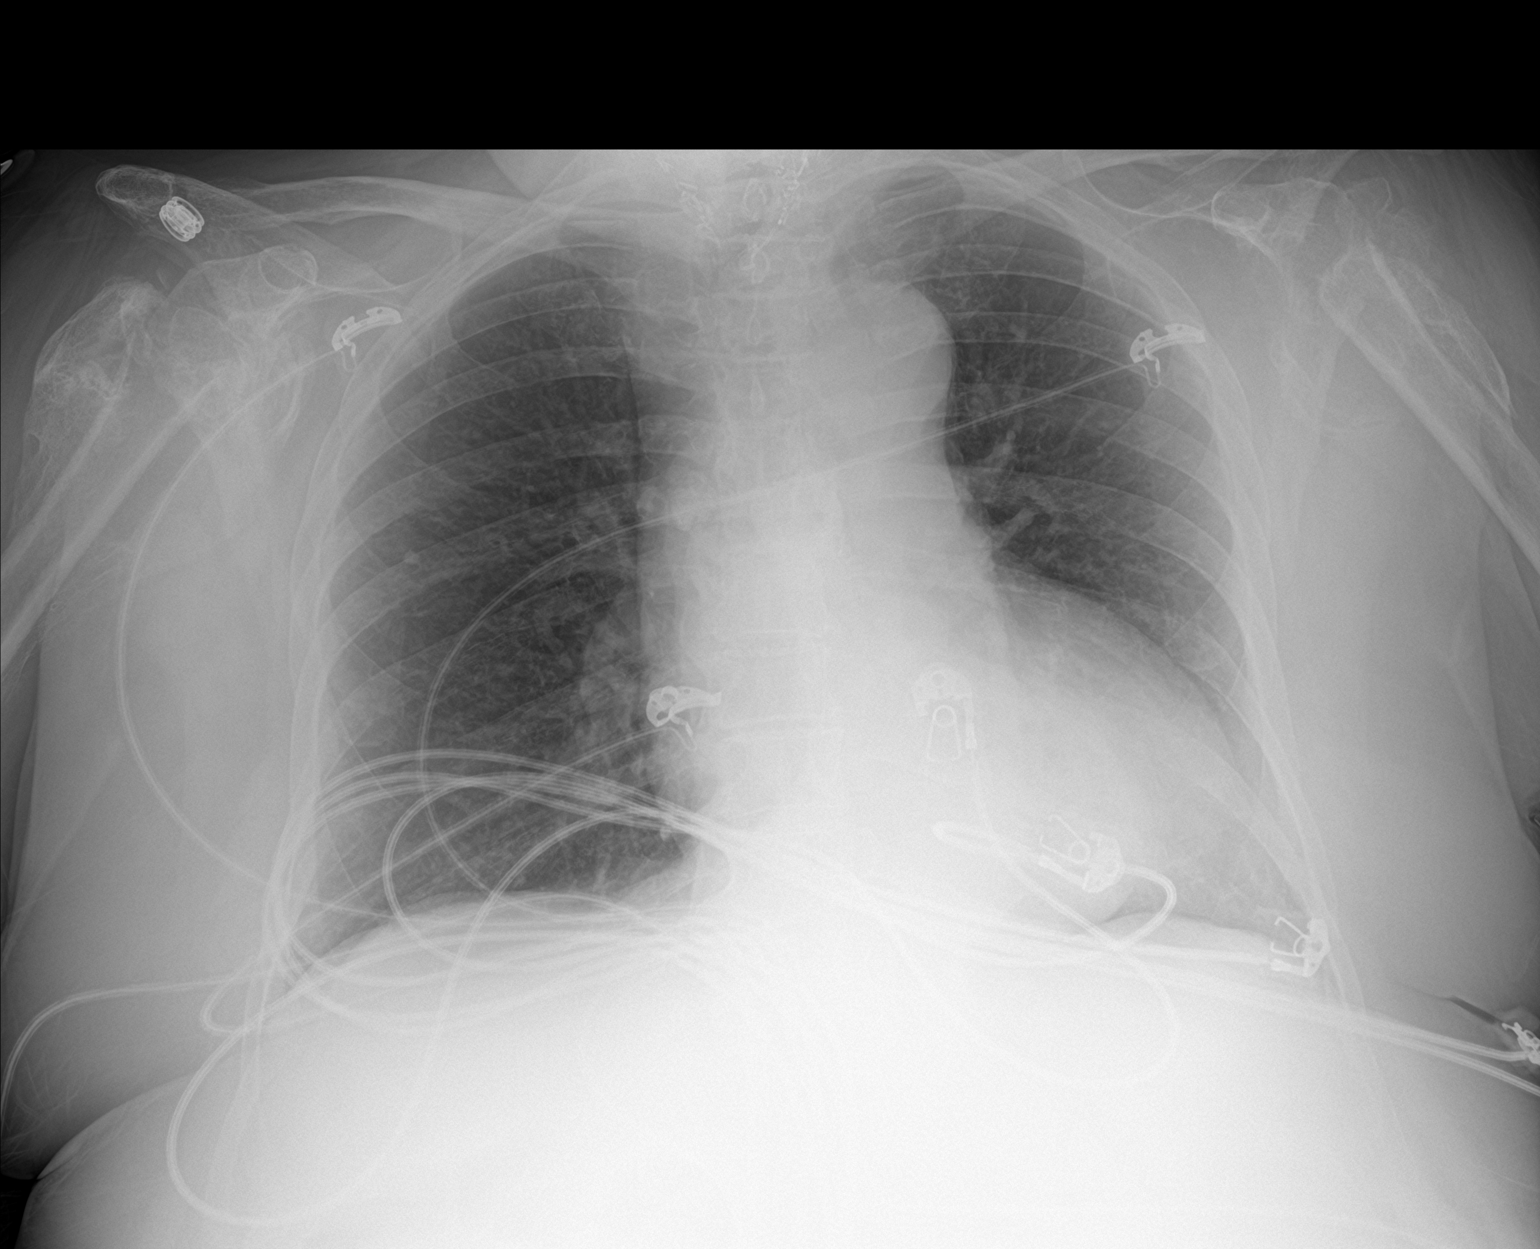

[2 of 2 positions shown; findings below may reference images not displayed]

FINDINGS: There is no edema or consolidation. Heart is mildly enlarged with
pulmonary vascularity within normal limits. There is a sizable
hiatal type hernia. There is postoperative change in the midline
cervical-thoracic junction region.

There are old fractures of both humeral heads with extensive
remodeling. Bones are osteoporotic. There is anterior wedging of a
midthoracic vertebral body.
IMPRESSION: No edema or consolidation. Heart mildly enlarged. Moderate hiatal
hernia. Bones osteoporotic. Chronic fractures of both proximal
humeri.

## 2018-05-05 IMAGING — US US SOFT TISSUE HEAD/NECK
1 series · 13 of 17 positions shown · non-contrast
Comparison: CTA of the chest on 07/30/2016

CLINICAL DATA: Status post prior thyroidectomy in 4456. Recent CT
of the chest demonstrates nodular soft tissue in the left superior
mediastinum suspicious for residual thyroid tissue/goiter.

EXAM:
ULTRASOUND OF HEAD/NECK SOFT TISSUES
TECHNIQUE: Ultrasound examination of the head and neck soft tissues was
performed in the area of clinical concern.

[Series 1: us soft tissue head/neck · 0.09mm/px · 17 acquisitions, 13 frames shown]
[im 1/17]
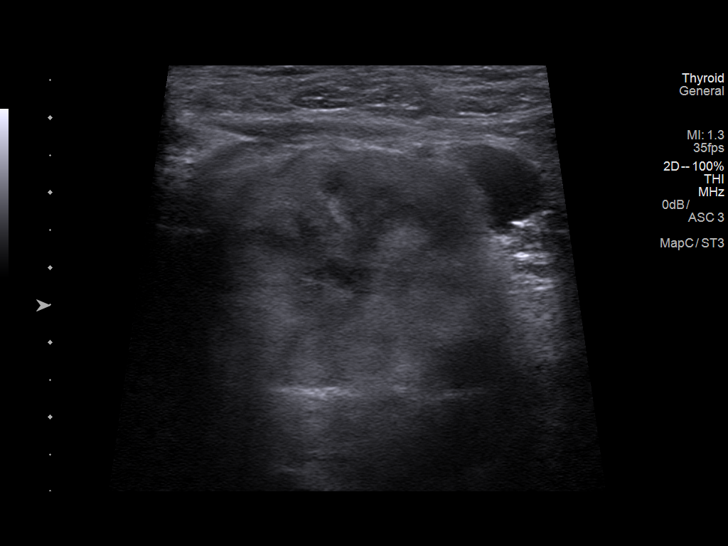
[im 2/17]
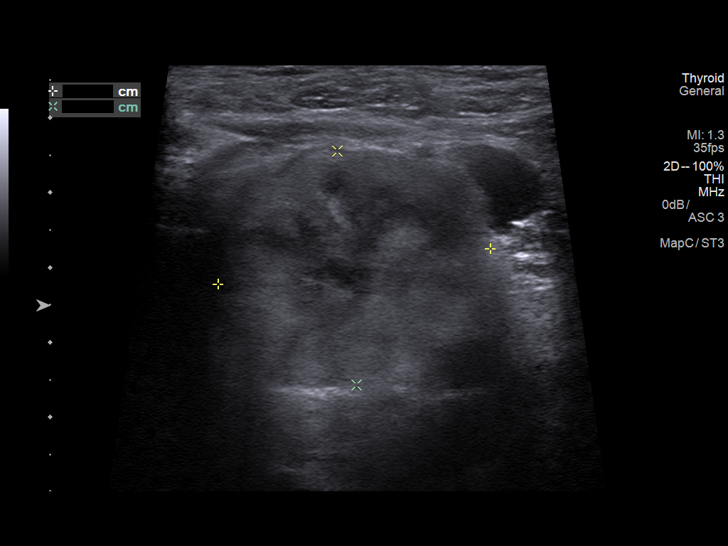
[im 4/17]
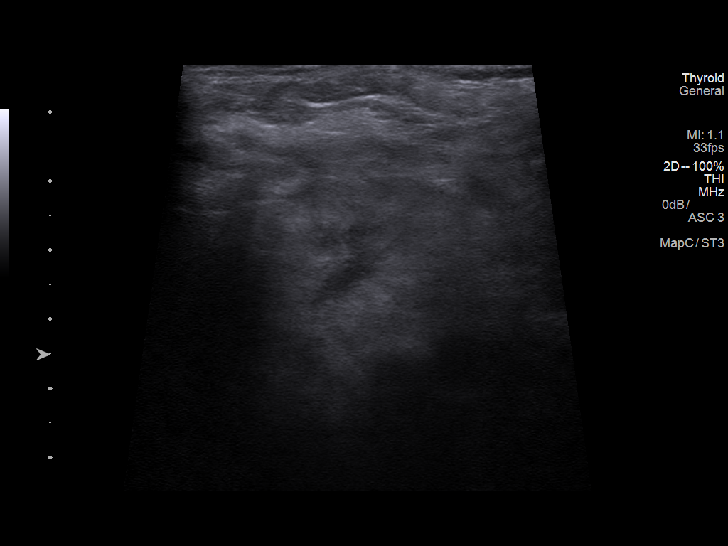
[im 5/17]
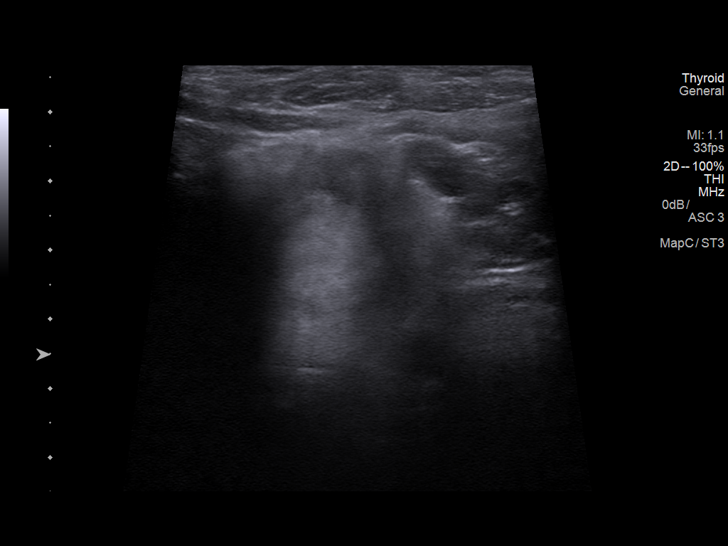
[im 6/17]
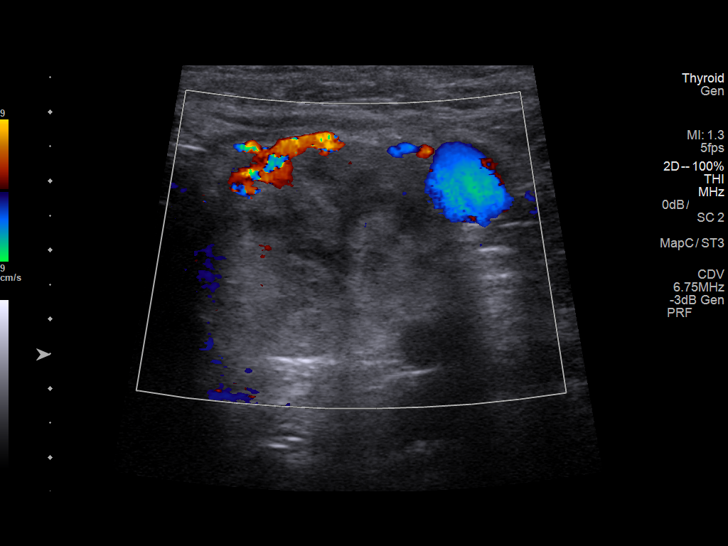
[im 8/17]
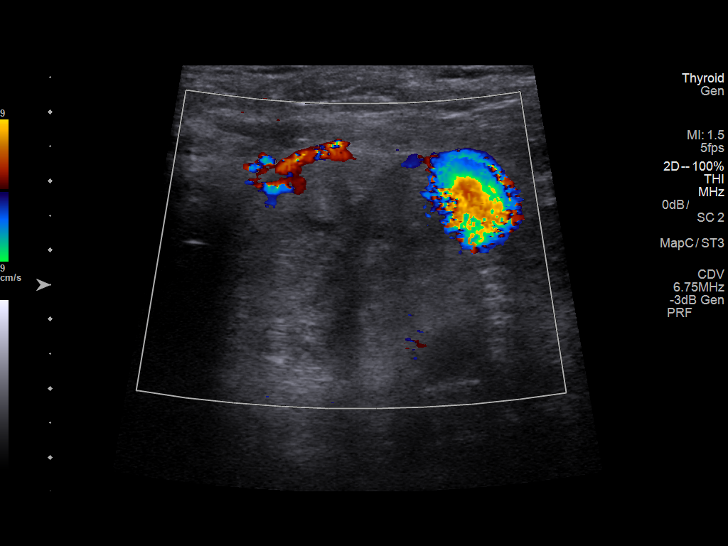
[im 9/17]
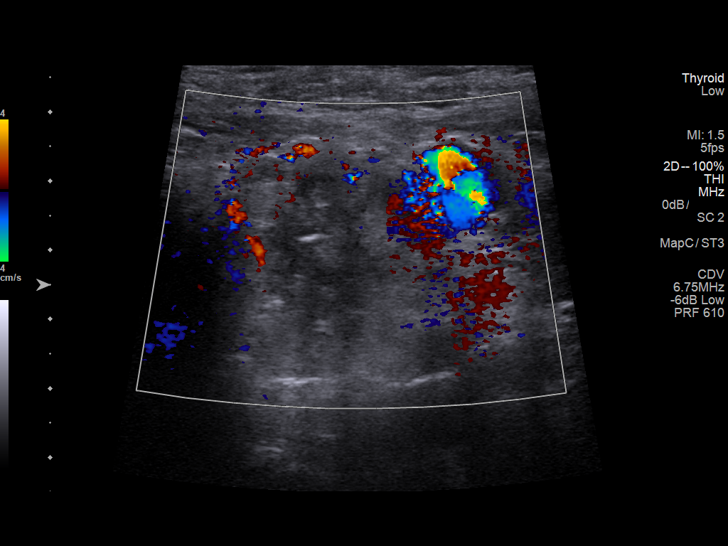
[im 10/17]
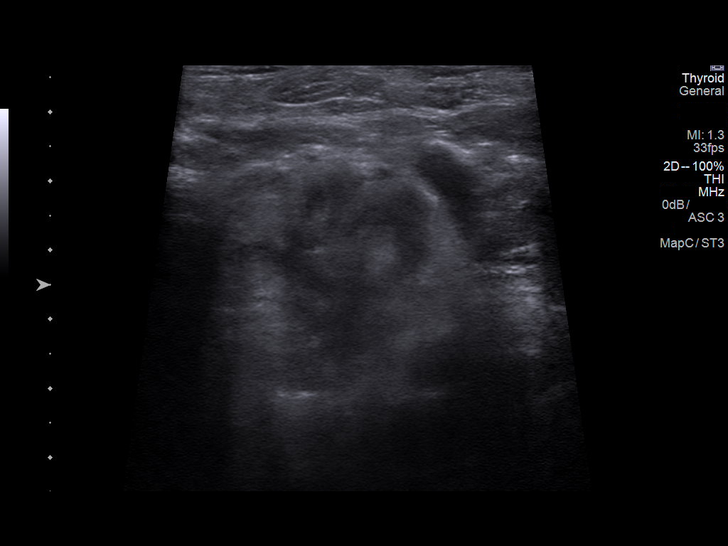
[im 12/17]
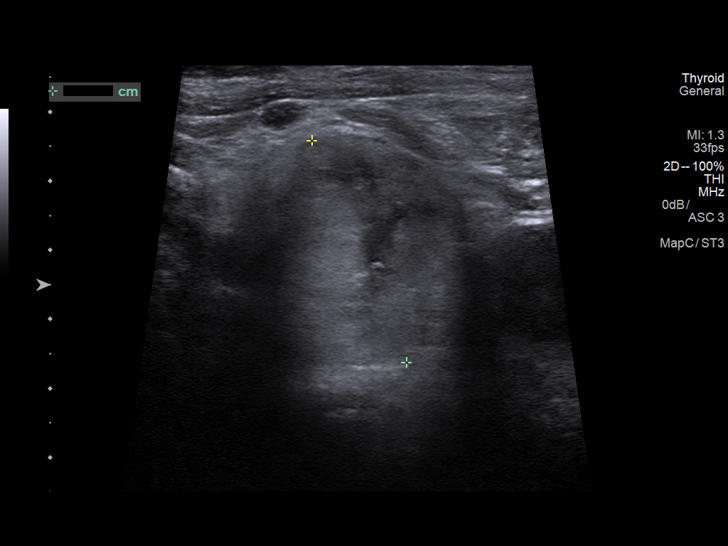
[im 13/17]
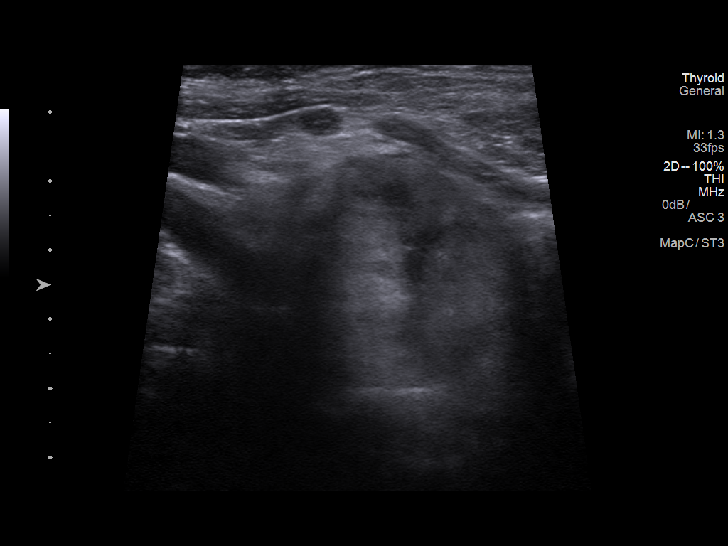
[im 14/17]
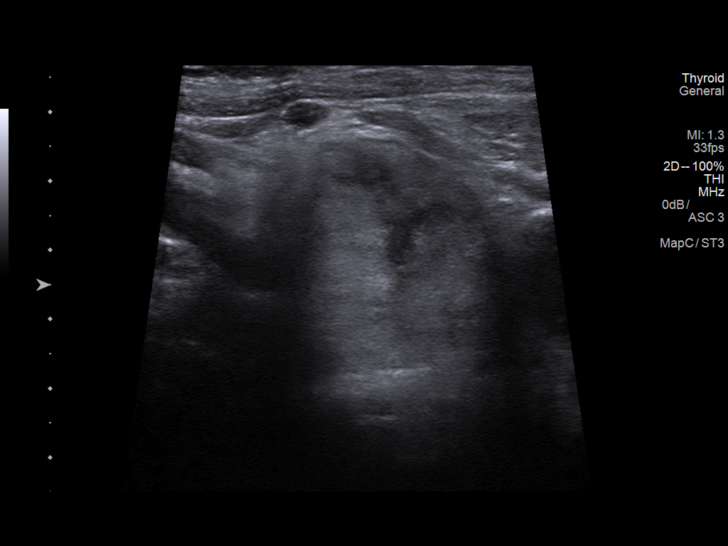
[im 16/17]
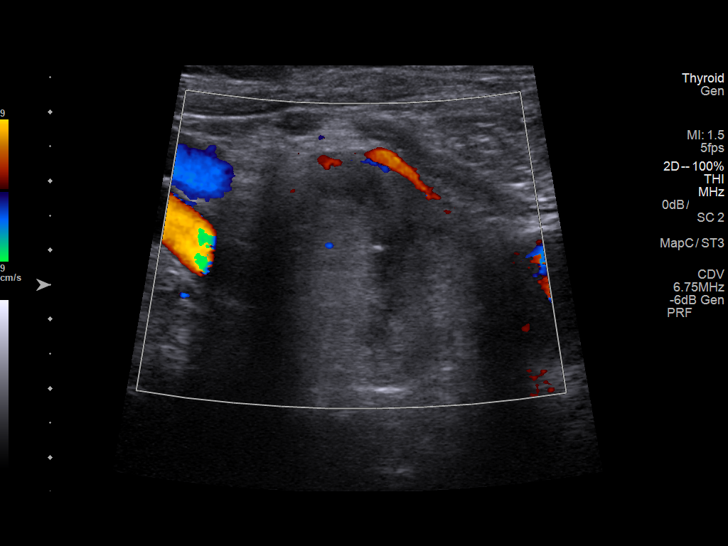
[im 17/17]
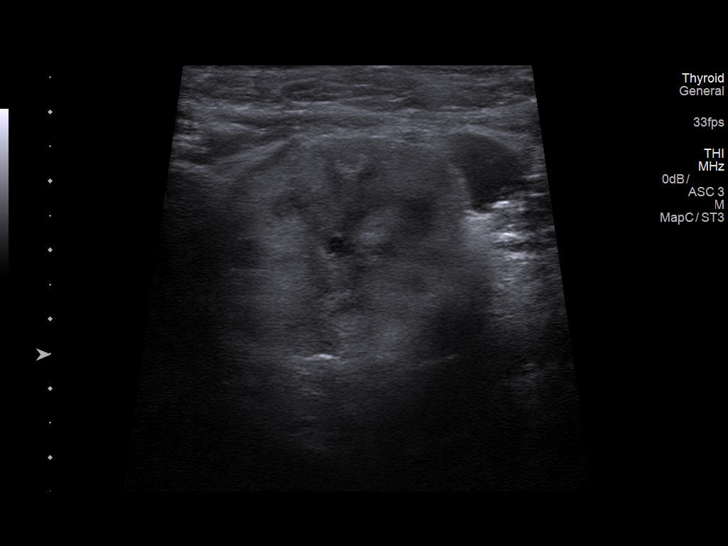

[13 of 17 positions shown; findings below may reference images not displayed]

FINDINGS: Ultrasound demonstrates nodular tissue in the left lower neck
extending into the upper mediastinum and measuring approximately
x 3.7 x 3.1 cm. This tissue is similar to thyroid tissue in
echogenicity. No focal mass or abnormal calcifications are seen.
There are a few small areas of central calcification within the
tissue and findings likely relate to residual thyroid tissue. This
has the appearance of part of a thyroid goiter that was not
completely resected.
IMPRESSION: Nodular tissue in the left lower neck extending into the upper left
mediastinum and measuring approximately 3.5 x 3.7 x 3.1 cm.
Sonographic appearance is consistent with thyroid tissue and most
likely relates to residual thyroid tissue that was not completely
resected at the time of thyroidectomy. Correlation suggested with
prior operative records and any previous postoperative ultrasound or
CT studies demonstrating residual thyroid tissue.

## 2023-06-26 DEATH — deceased
